# Patient Record
Sex: Male | Born: 1960 | Race: Black or African American | Hispanic: No | Marital: Married | Smoking: Former smoker
Health system: Southern US, Community
[De-identification: ages and names within clinical notes are randomized; demographics above are authoritative.]

## PROBLEM LIST (undated history)

## (undated) DIAGNOSIS — K219 Gastro-esophageal reflux disease without esophagitis: Secondary | ICD-10-CM

## (undated) DIAGNOSIS — E119 Type 2 diabetes mellitus without complications: Secondary | ICD-10-CM

## (undated) DIAGNOSIS — F32A Depression, unspecified: Secondary | ICD-10-CM

## (undated) DIAGNOSIS — I10 Essential (primary) hypertension: Secondary | ICD-10-CM

## (undated) DIAGNOSIS — R112 Nausea with vomiting, unspecified: Secondary | ICD-10-CM

## (undated) DIAGNOSIS — G4730 Sleep apnea, unspecified: Secondary | ICD-10-CM

## (undated) DIAGNOSIS — E7800 Pure hypercholesterolemia, unspecified: Secondary | ICD-10-CM

## (undated) DIAGNOSIS — F419 Anxiety disorder, unspecified: Secondary | ICD-10-CM

## (undated) DIAGNOSIS — Z9889 Other specified postprocedural states: Secondary | ICD-10-CM

## (undated) HISTORY — PX: KNEE ARTHROSCOPY WITH MENISCAL REPAIR: SHX5653

## (undated) HISTORY — PX: VASECTOMY: SHX75

## (undated) HISTORY — PX: WISDOM TOOTH EXTRACTION: SHX21

## (undated) HISTORY — PX: CHOLECYSTECTOMY: SHX55

## (undated) HISTORY — DX: Gastro-esophageal reflux disease without esophagitis: K21.9

---

## 2006-10-27 ENCOUNTER — Emergency Department (HOSPITAL_COMMUNITY): Admission: EM | Admit: 2006-10-27 | Discharge: 2006-10-27 | Payer: Self-pay | Admitting: Emergency Medicine

## 2006-11-12 ENCOUNTER — Emergency Department (HOSPITAL_COMMUNITY): Admission: EM | Admit: 2006-11-12 | Discharge: 2006-11-12 | Payer: Self-pay | Admitting: Emergency Medicine

## 2006-11-13 ENCOUNTER — Encounter: Admission: RE | Admit: 2006-11-13 | Discharge: 2006-11-13 | Payer: Self-pay | Admitting: Family Medicine

## 2007-08-02 ENCOUNTER — Ambulatory Visit (HOSPITAL_COMMUNITY): Admission: RE | Admit: 2007-08-02 | Discharge: 2007-08-02 | Payer: Self-pay | Admitting: Gastroenterology

## 2007-08-20 ENCOUNTER — Ambulatory Visit (HOSPITAL_COMMUNITY): Admission: RE | Admit: 2007-08-20 | Discharge: 2007-08-20 | Payer: Self-pay | Admitting: Surgery

## 2007-08-20 ENCOUNTER — Encounter (INDEPENDENT_AMBULATORY_CARE_PROVIDER_SITE_OTHER): Payer: Self-pay | Admitting: Surgery

## 2008-08-18 ENCOUNTER — Encounter: Admission: RE | Admit: 2008-08-18 | Discharge: 2008-08-18 | Payer: Self-pay | Admitting: Internal Medicine

## 2010-11-28 ENCOUNTER — Encounter: Payer: Self-pay | Admitting: Family Medicine

## 2011-03-22 NOTE — Op Note (Signed)
NAME:  CYPRUS, KUANG              ACCOUNT NO.:  [id number]   MEDICAL RECORD NO.:  [id number]          PATIENT TYPE:  AMB   LOCATION:  SDS                          FACILITY:  [facility]   PHYSICIAN:  Ardeth Sportsman, MD     DATE OF BIRTH:  [birth year]   DATE OF PROCEDURE:  08/20/2007  DATE OF DISCHARGE:                               OPERATIVE REPORT   PRIMARY CARE PHYSICIAN:  Betti D. Pecola Leisure, M.D., [facility].   GASTROENTEROLOGIST:  Anselmo Rod, M.D.   SURGEON:  Ardeth Sportsman, M.D.   ASSISTANT:  Marland Mcalpine, P.A. student.   PREOPERATIVE DIAGNOSES:  1. Symptomatic cholecystolithiasis.  2. Probable chronic cholecystitis.   POSTOPERATIVE DIAGNOSES:  1. Cholecystolithiasis.  2. Acute-on-chronic cholecystitis.   PROCEDURE:  Laparoscopic cholecystectomy with intraoperative  cholangiogram.   ANESTHESIA:  1. General anesthesia.  2. Local anesthetic in a field block around all port sites.   SPECIMENS:  Gallbladder.   DRAINS:  None.   ESTIMATED BLOOD LOSS:  30 mL.   COMPLICATIONS:  No major complications.   INDICATIONS:  Mr. Vanvranken is a 46-year gentleman with a story of  biliary colic and workup showing some gallbladder wall thickening and  numerous stones.  There is some question of maybe some cholecystitis.  This was done last month.  He had decreased gallbladder ejection  fraction as well.  Given classic the story, Dr. Loreta Ave felt he would  benefit from cholecystectomy and I agree.   The anatomy and physiology of hepatobiliary and pancreatic function was  discussed.  Pathophysiology of cholecystolithiasis with its risks of  gallstone pancreatitis, cholecystitis, choledocholithiasis, and other  risks were discussed.  The options were discussed recommendations made  for laparoscopic cholecystectomy with intraoperative cholangiogram.  The  risks such as stroke, heart attack, deep venous thrombosis, pulmonary  embolism, and death were discussed.  The risks such  as bleeding, need  for transfusion, wound infection, abscess, injury to other organs,  prolonged pain, incisional hernia, bile duct injury resulting in need  for operative reconstruction, and/or stenting, and other risks were  discussed.  Questions were answered, and he agreed to proceed.   PERIOPERATIVE FINDINGS:  He had gallbladder wall thickening, and his  gallbladder was almost completely full of stones.  He had a very turgid  gallbladder.  Cholangiogram appeared normal.  He had a fatty liver but  no discrete liver masses.   DESCRIPTION OF PROCEDURE:  Informed consent was confirmed.  The patient  had voided just prior to go int to the operating room.  He underwent  general anesthesia without any difficulty.  He had sequential  compression devices active during the entire case.  He underwent general  anesthesia without any problems.  He was positioned supine with both  arms tucked and protected with side rails.  His abdomen was prepped and  draped in sterile fashion.   Entry was gained in the abdomen with the patient in steep reversed  Trendelenburg with the right-side up using optical entry technique to  place a 5-mm port in the right upper quadrant.  Capnoperitoneum to 15 mm  provided good operative insufflation.  Under visualization, 5-mm ports  were placed through the umbilicus and one in the right flank.  A 10-mm  port was tunneled through the falciform ligament.   Camera inspection revealed no intra-abdominal injury.  The gallbladder  was distended and turgid.  It could not be easily grasped.  Aspiration  was done of a scant amount of bile only to shrink the gallbladder a  little bit, as it was extremely full of stones.  The gallbladder fundus  was grasped and elevated cephalad.  Peritoneal coverings between the  anteromedial and posterolateral sides of gallbladder and liver were  freed off.  Circumferential dissection was done to free the proximal  quarter of the  gallbladder off the liver bed.  A pulsatile structure  going into the anteromedial wall of the gallbladder consistent with  cystic anterior branch of the cystic artery was isolated.  Further  dissection revealed a posterior branch as well.  This left one  structure, the cystic duct, going from the gallbladder infundibulum down  to the porta hepatis.  One clip on the gallbladder side and two clips  were made on the anterior branch of the cystic artery.  This was  transected.  One clip was made on the gallbladder side on the posterior  branch and was it transected as well since the main branch had been  clamped off.  This left one structure consistent with the cystic duct.  One clip on the infundibulum was placed and a partial cystic ductotomy  was performed.   A 5-French cholangiocatheter was placed through a right subcostal stab  incision, flushed, and placed on the cystic duct and flushed.  Cholangiogram was run using diluted radio-opaque contrast and continuous  fluoroscopy.  Contrast flowed well from a side helical branch consistent  with cystic duct cannulization.  Contrast flowed well up a thin common  hepatic duct into the right and left intrahepatic chains.  Contrast  flowed down and across the common bile duct and across the normal  ampulla into the duodenum.  There was no evidence of any leak or stones  or lucencies.  This was consistent with a normal cholangiogram.  The  cholangiocatheter was removed.  Four clips remained on the cystic duct  just proximal to the partial cystic ductotomy, and cystic duct  transection was completed.   The gallbladder was freed from its remaining attachments on the liver.  It was placed in an EndoCatch bag.  The gallbladder was extremely thick  and dense and required expanding the fascial stitches about 2 cm in the  subxiphoid region to ultimately get it out.  The bag did rupture and had  to replace it in a new bag.  It was chocked full of  stones.  The port  was returned, and clamps were placed to help ensure capnoperitoneum.   Copious irrigation of almost 4 liters was done in the right upper  quadrant, with good clear return.  Hemostasis was excellent.  The clips  were intact on the cystic duct and arterial stumps with no evidence of  leak of bile or blood.  The upper abdominal ports were removed.  Capnoperitoneum was actively evacuated.  The ports were removed.  The  subxiphoid fascial defect was closed in transverse fashion using running  and interrupted #1 PDS to good result.  Copious irrigation of 500 mL was  done prior to closure and after closure of the fascia.  The skin was  closed using 4-0  Monocryl stitch at all port sites.  Sterile dressings  were applied.   The patient was extubated and taken to the recovery room in stable  condition.   I explained the operative findings to the patient's wife.  Postop  instructions were discussed as well.  Please note, I had discussed it  with the patient himself just prior to surgery as well.  Questions  answered and expressed understanding and appreciation.      Ardeth Sportsman, MD  Electronically Signed     SCG/MEDQ  D:  08/20/2007  T:  08/20/2007  Job:  [id number]

## 2011-05-25 ENCOUNTER — Ambulatory Visit: Payer: Self-pay

## 2011-05-25 ENCOUNTER — Other Ambulatory Visit: Payer: Self-pay | Admitting: Occupational Medicine

## 2011-05-25 DIAGNOSIS — R52 Pain, unspecified: Secondary | ICD-10-CM

## 2011-08-18 LAB — BASIC METABOLIC PANEL
BUN: 14
CO2: 27
Calcium: 9.4
Chloride: 104
Creatinine, Ser: 1.14
GFR calc Af Amer: >60
GFR calc non Af Amer: >60
Glucose, Bld: 113 — ABNORMAL HIGH
Potassium: 4.6
Sodium: 137

## 2011-08-18 LAB — CBC
HCT: 43.9
Hemoglobin: 14.7
MCHC: 33.6
MCV: 87.4
Platelets: 234
RBC: 5.02
RDW: 15.5 — ABNORMAL HIGH
WBC: 6.1

## 2011-11-19 ENCOUNTER — Emergency Department (HOSPITAL_COMMUNITY): Payer: Managed Care, Other (non HMO)

## 2011-11-19 ENCOUNTER — Emergency Department (HOSPITAL_COMMUNITY)
Admission: EM | Admit: 2011-11-19 | Discharge: 2011-11-19 | Disposition: A | Discharge status: Home / Self Care | Payer: Managed Care, Other (non HMO) | Attending: Emergency Medicine | Admitting: Emergency Medicine

## 2011-11-19 ENCOUNTER — Encounter (HOSPITAL_COMMUNITY): Payer: Self-pay | Admitting: Emergency Medicine

## 2011-11-19 DIAGNOSIS — M79609 Pain in unspecified limb: Secondary | ICD-10-CM | POA: Insufficient documentation

## 2011-11-19 DIAGNOSIS — R209 Unspecified disturbances of skin sensation: Secondary | ICD-10-CM | POA: Insufficient documentation

## 2011-11-19 DIAGNOSIS — M542 Cervicalgia: Secondary | ICD-10-CM | POA: Insufficient documentation

## 2011-11-19 DIAGNOSIS — I10 Essential (primary) hypertension: Secondary | ICD-10-CM | POA: Insufficient documentation

## 2011-11-19 DIAGNOSIS — M25519 Pain in unspecified shoulder: Secondary | ICD-10-CM | POA: Insufficient documentation

## 2011-11-19 DIAGNOSIS — E7800 Pure hypercholesterolemia, unspecified: Secondary | ICD-10-CM | POA: Insufficient documentation

## 2011-11-19 HISTORY — DX: Pure hypercholesterolemia, unspecified: E78.00

## 2011-11-19 HISTORY — DX: Essential (primary) hypertension: I10

## 2011-11-19 MED ORDER — MORPHINE SULFATE 4 MG/ML IJ SOLN
6.0000 mg | Freq: Once | INTRAMUSCULAR | Status: AC
  Administered 2011-11-19: 6 mg via INTRAMUSCULAR
  Filled 2011-11-19 (×2): qty 1

## 2011-11-19 MED ORDER — OXYCODONE HCL 10 MG PO TB12: 10.0000 mg | ORAL_TABLET | Freq: Two times a day (BID) | ORAL | Status: AC

## 2011-11-19 MED ORDER — MORPHINE SULFATE 4 MG/ML IJ SOLN
4.0000 mg | Freq: Once | INTRAMUSCULAR | Status: AC
  Administered 2011-11-19: 4 mg via INTRAMUSCULAR
  Filled 2011-11-19: qty 1

## 2011-11-19 NOTE — ED Notes (Signed)
Pt c/o neck pain x 6 days; pt sts woke up with pain; pt sts pain worse with movement; pt denies obvious injury; pt sts went to [facility] on Monday for same and was given meds that are not helping

## 2011-11-19 NOTE — ED Notes (Signed)
Spoke w/Christina in MRI - advised MRI of neck is to be w/o constrast - as per Fayrene Helper, PA.  She advised MRI table limit is 350lb and will try to perform on pt.

## 2011-11-19 NOTE — ED Notes (Signed)
Spoke w/radiology - advised will be coming to transport pt next.

## 2011-11-19 NOTE — ED Provider Notes (Signed)
Medical screening examination/treatment/procedure(s) were performed by non-physician practitioner and as supervising physician I was immediately available for consultation/collaboration.   Leigh-Ann Uriel Dowding, MD 11/19/11 1317 

## 2011-11-19 NOTE — ED Notes (Signed)
Pt spoke w/family member who advised will come to ED to pick him up.

## 2011-11-19 NOTE — ED Provider Notes (Signed)
History     CSN: [id number]  Arrival date & time 11/19/11  0715   First MD Initiated Contact with Patient 11/19/11 [phone]      Chief Complaint  Patient presents with  . Neck Pain    (Consider location/radiation/quality/duration/timing/severity/associated sxs/prior treatment) HPI  51 year old male presenting today with chief complaints of neck pain. About 6 days ago patient woke up with pain to the back of his neck. Pain is described as dull, constant, and intense.  Neck pain worsened with hyper extension and improves with flexion. Patient also notice pain shooting and radiating down to right shoulder and arm with associated tingling numbness sensation. Patient denies fever, headache, chest pain, shortness of breath, rash. He denies any prior strenuous activity or heavy lifting. He has been seen and evaluated at an urgent care center 6 days ago. He was given prednisone, oxycodone, diclofenac.  He has been taking medication as prescribed but has not noticed any improvement. He is having trouble sleeping due to pain. Patient doesn't recall a distant history of herniated disc in neck, which does not required surgery.    Past Medical History  Diagnosis Date  . Hypertension   . Hypercholesterolemia     History reviewed. No pertinent past surgical history.  History reviewed. No pertinent family history.  History  Substance Use Topics  . Smoking status: Never Smoker   . Smokeless tobacco: Not on file  . Alcohol Use: No      Review of Systems  All other systems reviewed and are negative.    Allergies  Codeine  Home Medications  No current outpatient prescriptions on file.  BP 142/79  Pulse 84  Temp(Src) 97.9 F (36.6 C) (Oral)  Resp 18  SpO2 97%  Physical Exam  Nursing note and vitals reviewed. Constitutional: He is oriented to person, place, and time. He appears well-developed and well-nourished.       Awake, alert, nontoxic appearance.  Obese  HENT:  Head:  Normocephalic and atraumatic.  Eyes: Right eye exhibits no discharge. Left eye exhibits no discharge.  Neck: Normal range of motion. Neck supple.       Midline tenderness, with increase tenderness with hyperextension.  No nuchal rigidity, no rash, or overlying skin changes.  Sensation intact.  Pulmonary/Chest: Effort normal. He exhibits no tenderness.  Abdominal: There is no tenderness. There is no rebound.  Musculoskeletal: He exhibits no tenderness.       Right shoulder: Normal.       Left shoulder: Normal.       Arms:      Baseline ROM, no obvious new focal weakness  Neurological: He is alert and oriented to person, place, and time. He displays normal reflexes. He exhibits normal muscle tone. Coordination normal.       Mental status and motor strength appears baseline for patient and situation  Skin: No rash noted.  Psychiatric: He has a normal mood and affect.    ED Course  Procedures (including critical care time)  Labs Reviewed - No data to display No results found.   No diagnosis found.    MDM  Neck pain unrelieved with prednisone/oxycodone/diclofenac.  Pain suggestive of cervical disc herniation.  No obvious upper motor neuron deficits, normal lower motor neuron function.  Doubt infection, or cardiac related.  Doubt stroke, or brain infection.  9:05 AM Patient was unable to fit in closed MRI machine. Therefore, patient will likely need an open MRI unit for further evaluation.  However, this is non emergent,  and pt is in no acute distress with stable vital sign.  I will give him a referral to orthopedist for further management.  Pt was recommended to double up on his pain medication as current dose is subtherapeutic due to body habitus.  Today C-spine xray reveals no acute findings.      Fayrene Helper, PA-C 11/19/11 1029

## 2012-01-06 ENCOUNTER — Other Ambulatory Visit: Payer: Self-pay | Admitting: Family Medicine

## 2012-01-06 DIAGNOSIS — M542 Cervicalgia: Secondary | ICD-10-CM

## 2012-01-12 ENCOUNTER — Ambulatory Visit
Admission: RE | Admit: 2012-01-12 | Discharge: 2012-01-12 | Disposition: A | Discharge status: Home / Self Care | Payer: PRIVATE HEALTH INSURANCE | Source: Ambulatory Visit | Attending: Family Medicine | Admitting: Family Medicine

## 2012-01-12 DIAGNOSIS — M542 Cervicalgia: Secondary | ICD-10-CM

## 2013-09-30 ENCOUNTER — Encounter: Payer: Self-pay | Admitting: Podiatry

## 2013-09-30 ENCOUNTER — Ambulatory Visit (INDEPENDENT_AMBULATORY_CARE_PROVIDER_SITE_OTHER): Payer: Managed Care, Other (non HMO) | Admitting: Podiatry

## 2013-09-30 VITALS — BP 141/78 | HR 63 | Resp 16 | Ht 74.0 in | Wt 325.0 lb

## 2013-09-30 DIAGNOSIS — Q828 Other specified congenital malformations of skin: Secondary | ICD-10-CM

## 2013-09-30 NOTE — Progress Notes (Signed)
  Subjective:    Patient ID: Juan [name], male    DOB: Jun 27, 1961, 52 y.o.   MRN: [id number] "I've got this painful growth on the side of my left foot."  Foot Pain This is a new problem. Episode onset: 1 year. The problem occurs intermittently. The problem has been gradually worsening. The symptoms are aggravated by walking and standing (when I wear certain shoes). Treatments tried: My mom trimmed it a little bit.  I put some Salicylic Acid on it. The treatment provided mild relief.      Review of Systems  All other systems reviewed and are negative.       Objective:   Physical Exam Vascular: The DP PT pulses are two over four bilaterally  Neurological: Sensation intact bilaterally  Dermatological: Nucleated keratoses plantar lateral aspect of the fifth left MPJ.  Musculoskeletal: No deformities noted bilaterally.       Assessment & Plan:   Assessment: Porokeratoses left foot x1  Plan: The porokeratoses was debrided and packed with salinocaine. Patient advised that this will become a chronic lesion and to return as needed. I also made him aware that he could apply some topical callus remover to this area it if he chose to on an as-needed basis.

## 2015-11-08 HISTORY — PX: COLONOSCOPY: SHX174

## 2019-04-25 ENCOUNTER — Other Ambulatory Visit: Payer: Self-pay | Admitting: Urology

## 2019-06-10 ENCOUNTER — Other Ambulatory Visit (HOSPITAL_COMMUNITY)
Admission: RE | Admit: 2019-06-10 | Discharge: 2019-06-10 | Disposition: A | Discharge status: Home / Self Care | Payer: 59 | Source: Ambulatory Visit | Attending: Urology | Admitting: Urology

## 2019-06-10 ENCOUNTER — Other Ambulatory Visit (HOSPITAL_COMMUNITY): Payer: Self-pay | Admitting: *Deleted

## 2019-06-10 DIAGNOSIS — Z20828 Contact with and (suspected) exposure to other viral communicable diseases: Secondary | ICD-10-CM | POA: Insufficient documentation

## 2019-06-10 DIAGNOSIS — N400 Enlarged prostate without lower urinary tract symptoms: Secondary | ICD-10-CM | POA: Diagnosis not present

## 2019-06-10 DIAGNOSIS — Z01812 Encounter for preprocedural laboratory examination: Secondary | ICD-10-CM | POA: Insufficient documentation

## 2019-06-10 LAB — SARS CORONAVIRUS 2 (TAT 6-24 HRS): SARS Coronavirus 2: NEGATIVE

## 2019-06-10 NOTE — Patient Instructions (Addendum)
YOU HAD A COVID 19 TEST ON 06-10-2019. ONCE YOUR COVID TEST IS COMPLETED, PLEASE BEGIN THE QUARANTINE INSTRUCTIONS AS OUTLINED IN YOUR HANDOUT.                Juan [name]    Your procedure is scheduled on: 06-13-2019   Report to [facility] Main  Entrance  Report to admitting at 10:30  AM   1 VISITOR IS ALLOWED TO WAIT IN WAITING ROOM  ONLY DAY OF YOUR SURGERY.  No family in Short Stay   Call this number if you have problems the morning of surgery [phone]    Remember: Do not eat food :After Midnight Tuesday NIGHT,                                     CLEAR LIQUIDS ALL DAY Wednesday 06-12-2019   NO CLEAR LIQUIDS AFTER MIDNIGHT. 8/5  FOLLOW ALL BOWEL PREP INSTRUCTIONS FROM DR [facility].   BRUSH YOUR TEETH MORNING OF SURGERY AND RINSE YOUR MOUTH OUT, NO CHEWING GUM CANDY OR MINTS.      CLEAR LIQUID DIET   Foods Allowed                                                                     Foods Excluded  Coffee and tea, regular and decaf                             liquids that you cannot  Plain Jell-O any favor except red or purple                                           see through such as: Fruit ices (not with fruit pulp)                                     milk, soups, orange juice  Iced Popsicles                                    All solid food Carbonated beverages, regular and diet                                    Cranberry, grape and apple juices Sports drinks like Gatorade Lightly seasoned clear broth or consume(fat free) Sugar, honey syrup  Sample Menu Breakfast                                Lunch                                     Supper Cranberry juice  Beef broth                            Chicken broth Jell-O                                     Grape juice                           Apple juice Coffee or tea                        Jell-O                                      Popsicle                                                 Coffee or tea                        Coffee or tea  _____________________________________________________________________    Take these medicines the morning of surgery with A SIP OF WATER: Prilosec              You may not have any metal on your body including piercings               Do not wear jewelry,  lotions, powders or  deodorant                          Men may shave face and neck.   Do not bring valuables to the hospital. [place].  Contacts, dentures or bridgework may not be worn into surgery.      [place] - Preparing for Surgery Before surgery, you can play an important role.   Because skin is not sterile, your skin needs to be as free of germs as possible.   You can reduce the number of germs on your skin by washing with CHG (chlorahexidine gluconate) soap before surgery .  CHG is an antiseptic cleaner which kills germs and bonds with the skin to continue killing germs even after washing. Please DO NOT use if you have an allergy to CHG or antibacterial soaps.   If your skin becomes reddened/irritated stop using the CHG and inform your nurse when you arrive at Short Stay. .  You may shave your face/neck. Please follow these instructions carefully:  1.  Shower with CHG Soap the night before surgery and the  morning of Surgery.  2.  If you choose to wash your hair, wash your hair first as usual with your  normal  shampoo.  3.  After you shampoo, rinse your hair and body thoroughly to remove the  shampoo.  4.  Use CHG as you would any other liquid soap.  You can apply chg directly  to the skin and wash                       Gently with a scrungie or clean washcloth.  5.  Apply the CHG Soap to your body ONLY FROM THE NECK DOWN.   Do not use on face/ open                           Wound or open sores. Avoid contact with eyes, ears mouth and genitals (private parts).                        Wash face,  Genitals (private parts) with your normal soap.             6.  Wash thoroughly, paying special attention to the area where your surgery  will be performed.  7.  Thoroughly rinse your body with warm water from the neck down.  8.  DO NOT shower/wash with your normal soap after using and rinsing off  the CHG Soap.              9.  Pat yourself dry with a clean towel.            10.  Wear clean pajamas.            11.  Place clean sheets on your bed the night of your first shower and do not  sleep with pets. Day of Surgery : Do not apply any lotions/deodorants the morning of surgery.  Please wear clean clothes to the hospital/surgery center.  FAILURE TO FOLLOW THESE INSTRUCTIONS MAY RESULT IN THE CANCELLATION OF YOUR SURGERY PATIENT SIGNATURE_________________________________  NURSE SIGNATURE__________________________________  ________________________________________________________________________

## 2019-06-11 ENCOUNTER — Other Ambulatory Visit: Payer: Self-pay

## 2019-06-11 ENCOUNTER — Encounter (INDEPENDENT_AMBULATORY_CARE_PROVIDER_SITE_OTHER): Payer: Self-pay

## 2019-06-11 ENCOUNTER — Encounter (HOSPITAL_COMMUNITY)
Admission: RE | Admit: 2019-06-11 | Discharge: 2019-06-11 | Disposition: A | Discharge status: Home / Self Care | Payer: 59 | Source: Ambulatory Visit | Attending: Urology | Admitting: Urology

## 2019-06-11 ENCOUNTER — Encounter (HOSPITAL_COMMUNITY): Payer: Self-pay

## 2019-06-11 DIAGNOSIS — N400 Enlarged prostate without lower urinary tract symptoms: Secondary | ICD-10-CM | POA: Insufficient documentation

## 2019-06-11 DIAGNOSIS — I10 Essential (primary) hypertension: Secondary | ICD-10-CM | POA: Insufficient documentation

## 2019-06-11 DIAGNOSIS — Z01818 Encounter for other preprocedural examination: Secondary | ICD-10-CM | POA: Insufficient documentation

## 2019-06-11 DIAGNOSIS — R9431 Abnormal electrocardiogram [ECG] [EKG]: Secondary | ICD-10-CM | POA: Insufficient documentation

## 2019-06-11 HISTORY — DX: Nausea with vomiting, unspecified: R11.2

## 2019-06-11 HISTORY — DX: Sleep apnea, unspecified: G47.30

## 2019-06-11 HISTORY — DX: Other specified postprocedural states: Z98.890

## 2019-06-11 LAB — BASIC METABOLIC PANEL
Anion gap: 8 (ref 5–15)
BUN: 16 mg/dL (ref 6–20)
CO2: 26 mmol/L (ref 22–32)
Calcium: 9.1 mg/dL (ref 8.9–10.3)
Chloride: 103 mmol/L (ref 98–111)
Creatinine, Ser: 1.3 mg/dL — ABNORMAL HIGH (ref 0.61–1.24)
GFR calc Af Amer: >60 mL/min (ref >60)
GFR calc non Af Amer: >60 mL/min (ref >60)
Glucose, Bld: 164 mg/dL — ABNORMAL HIGH (ref 70–99)
Potassium: 4.2 mmol/L (ref 3.5–5.1)
Sodium: 137 mmol/L (ref 135–145)

## 2019-06-11 LAB — CBC
HCT: 49.6 % (ref 39.0–52.0)
Hemoglobin: 15.2 g/dL (ref 13.0–17.0)
MCH: 28 pg (ref 26.0–34.0)
MCHC: 30.6 g/dL (ref 30.0–36.0)
MCV: 91.5 fL (ref 80.0–100.0)
Platelets: 186 10*3/uL (ref 150–400)
RBC: 5.42 MIL/uL (ref 4.22–5.81)
RDW: 15.6 % — ABNORMAL HIGH (ref 11.5–15.5)
WBC: 6.2 10*3/uL (ref 4.0–10.5)
nRBC: 0 % (ref 0.0–0.2)

## 2019-06-11 MED ORDER — MAGNESIUM CITRATE PO SOLN
1.0000 | Freq: Once | ORAL | Status: DC
  Filled 2019-06-11: qty 296

## 2019-06-12 MED ORDER — DEXTROSE 5 % IV SOLN
3.0000 g | INTRAVENOUS | Status: AC
  Administered 2019-06-13: 3 g via INTRAVENOUS
  Filled 2019-06-12: qty 3

## 2019-06-12 NOTE — Anesthesia Preprocedure Evaluation (Addendum)
Anesthesia Evaluation  Patient identified by MRN, date of birth, ID band Patient awake    Reviewed: Allergy & Precautions, NPO status , Patient's Chart, lab work & pertinent test results  History of Anesthesia Complications (+) PONV  Airway Mallampati: III  TM Distance: >3 FB Neck ROM: Full    Dental  (+) Teeth Intact, Dental Advisory Given   Pulmonary sleep apnea , former smoker,    breath sounds clear to auscultation       Cardiovascular hypertension,  Rhythm:Regular Rate:Normal     Neuro/Psych negative neurological ROS  negative psych ROS   GI/Hepatic Neg liver ROS, GERD  ,  Endo/Other  negative endocrine ROS  Renal/GU negative Renal ROS     Musculoskeletal negative musculoskeletal ROS (+)   Abdominal (+) + obese,   Peds  Hematology negative hematology ROS (+)   Anesthesia Other Findings   Reproductive/Obstetrics                            Anesthesia Physical Anesthesia Plan  ASA: III  Anesthesia Plan: General   Post-op Pain Management:    Induction: Intravenous  PONV Risk Score and Plan: 4 or greater and Ondansetron, Dexamethasone, Midazolam and Scopolamine patch - Pre-op  Airway Management Planned: Oral ETT  Additional Equipment: None  Intra-op Plan:   Post-operative Plan: Extubation in OR  Informed Consent: I have reviewed the patients History and Physical, chart, labs and discussed the procedure including the risks, benefits and alternatives for the proposed anesthesia with the patient or authorized representative who has indicated his/her understanding and acceptance.     Dental advisory given  Plan Discussed with: CRNA  Anesthesia Plan Comments: (See PAT note 06/11/2019, Konrad Felix, PA-C  COVID-19 Labs  No results for input(s): DDIMER, FERRITIN, LDH, CRP in the last 72 hours.  Lab Results      Component                Value               Date                       [id number]              NEGATIVE            06/10/2019            )       Anesthesia Quick Evaluation

## 2019-06-12 NOTE — Progress Notes (Signed)
Anesthesia Chart Review   Case: [id number] Date/Time: 06/13/19 1200   Procedure: XI ROBOTIC ASSISTED SIMPLE PROSTATECTOMY (N/A ) - 3 HRS   Anesthesia type: General   Pre-op diagnosis: BENIGN PROSTATIC HYPERPLASIA   Location: [facility] ROOM 03 / [facility]   Surgeon: Cleon Gustin, MD      DISCUSSION:58 y.o. former smoker (5 pack years, quit 11/07/97) with h/o PONV, GERD, HTN, sleep apnea, BPH scheduled for above procedure 06/13/2019 with Dr. Nicolette Bang.   Elevated BP at PAT visit.  Pt reports he checks his BP at home regularly and it is typically around 140/85.  He has taken medications today.  Asymptomatic.  Will evaluate DOS. [facility]   VS: BP (!) 181/101   Pulse 75   Temp 37.3 C (Oral)   Resp 18   Ht 6\' 2"  (1.88 m)   Wt (!) 159.8 kg   SpO2 99%   BMI 45.23 kg/m   PROVIDERS: Patient, No Pcp Per   LABS: Labs reviewed: Acceptable for surgery. (all labs ordered are listed, but only abnormal results are displayed)  Labs Reviewed  CBC - Abnormal; Notable for the following components:      Result Value   RDW 15.6 (*)    All other components within normal limits  BASIC METABOLIC PANEL - Abnormal; Notable for the following components:   Glucose, Bld 164 (*)    Creatinine, Ser 1.30 (*)    All other components within normal limits     IMAGES:   EKG: 06/11/2019 Rate 79 bpm Normal sinus rhythm Nonspecific T wave abnormality Abnormal ECG Since last tracing QT has shortened HEART RATE has decreased  CV:  Past Medical History:  Diagnosis Date  . GERD (gastroesophageal reflux disease)    mild  . Hypercholesterolemia   . Hypertension   . PONV (postoperative nausea and vomiting)   . Sleep apnea     Past Surgical History:  Procedure Laterality Date  . CHOLECYSTECTOMY    . COLONOSCOPY  2017  . KNEE ARTHROSCOPY WITH MENISCAL REPAIR Right   . VASECTOMY    . WISDOM TOOTH EXTRACTION Bilateral     MEDICATIONS: . diphenhydrAMINE HCl, Sleep, (ZZZQUIL) 25 MG  CAPS  . ibuprofen (ADVIL) 200 MG tablet  . lisinopril (PRINIVIL,ZESTRIL) 20 MG tablet  . omeprazole (PRILOSEC) 20 MG capsule  . oxymetazoline (AFRIN) 0.05 % nasal spray  . pravastatin (PRAVACHOL) 40 MG tablet   No current facility-administered medications for this encounter.    Derrill Memo ON 06/13/2019] ceFAZolin (ANCEF) 3 g in dextrose 5 % 50 mL IVPB    Maia Plan [facility] Pre-Surgical Testing [phone] 06/12/19  1:01 PM

## 2019-06-13 ENCOUNTER — Inpatient Hospital Stay (HOSPITAL_COMMUNITY)
Admission: RE | Admit: 2019-06-13 | Discharge: 2019-06-20 | DRG: 707 | Disposition: A | Discharge status: Home / Self Care | Payer: 59 | Attending: Urology | Admitting: Urology

## 2019-06-13 ENCOUNTER — Inpatient Hospital Stay (HOSPITAL_COMMUNITY): Payer: 59 | Admitting: Certified Registered Nurse Anesthetist

## 2019-06-13 ENCOUNTER — Encounter (HOSPITAL_COMMUNITY): Payer: Self-pay | Admitting: *Deleted

## 2019-06-13 ENCOUNTER — Other Ambulatory Visit: Payer: Self-pay

## 2019-06-13 ENCOUNTER — Encounter (HOSPITAL_COMMUNITY)
Admission: RE | Disposition: A | Discharge status: Home / Self Care | Payer: Self-pay | Source: Home / Self Care | Attending: Urology

## 2019-06-13 DIAGNOSIS — N401 Enlarged prostate with lower urinary tract symptoms: Secondary | ICD-10-CM | POA: Diagnosis present

## 2019-06-13 DIAGNOSIS — N3289 Other specified disorders of bladder: Secondary | ICD-10-CM | POA: Diagnosis present

## 2019-06-13 DIAGNOSIS — Z87891 Personal history of nicotine dependence: Secondary | ICD-10-CM | POA: Diagnosis not present

## 2019-06-13 DIAGNOSIS — Z8249 Family history of ischemic heart disease and other diseases of the circulatory system: Secondary | ICD-10-CM

## 2019-06-13 DIAGNOSIS — C61 Malignant neoplasm of prostate: Secondary | ICD-10-CM | POA: Diagnosis present

## 2019-06-13 DIAGNOSIS — I10 Essential (primary) hypertension: Secondary | ICD-10-CM | POA: Diagnosis present

## 2019-06-13 DIAGNOSIS — Z9049 Acquired absence of other specified parts of digestive tract: Secondary | ICD-10-CM | POA: Diagnosis not present

## 2019-06-13 DIAGNOSIS — R350 Frequency of micturition: Secondary | ICD-10-CM | POA: Diagnosis present

## 2019-06-13 DIAGNOSIS — Z885 Allergy status to narcotic agent status: Secondary | ICD-10-CM

## 2019-06-13 DIAGNOSIS — E7800 Pure hypercholesterolemia, unspecified: Secondary | ICD-10-CM | POA: Diagnosis present

## 2019-06-13 DIAGNOSIS — N138 Other obstructive and reflux uropathy: Secondary | ICD-10-CM | POA: Diagnosis present

## 2019-06-13 DIAGNOSIS — K219 Gastro-esophageal reflux disease without esophagitis: Secondary | ICD-10-CM | POA: Diagnosis present

## 2019-06-13 HISTORY — PX: XI ROBOTIC ASSISTED SIMPLE PROSTATECTOMY: SHX6713

## 2019-06-13 LAB — HEMOGLOBIN AND HEMATOCRIT, BLOOD
HCT: 52.2 % — ABNORMAL HIGH (ref 39.0–52.0)
HCT: 48.4 % (ref 39.0–52.0)
Hemoglobin: 16 g/dL (ref 13.0–17.0)
Hemoglobin: 15.1 g/dL (ref 13.0–17.0)

## 2019-06-13 LAB — GLUCOSE, CAPILLARY: Glucose-Capillary: 148 mg/dL — ABNORMAL HIGH (ref 70–99)

## 2019-06-13 SURGERY — PROSTATECTOMY, SIMPLE, ROBOT-ASSISTED
Anesthesia: General

## 2019-06-13 MED ORDER — DEXAMETHASONE SODIUM PHOSPHATE 10 MG/ML IJ SOLN
INTRAMUSCULAR | Status: AC
  Filled 2019-06-13: qty 1

## 2019-06-13 MED ORDER — HYDROMORPHONE HCL 1 MG/ML IJ SOLN
INTRAMUSCULAR | Status: AC
  Filled 2019-06-13: qty 2

## 2019-06-13 MED ORDER — SUGAMMADEX SODIUM 500 MG/5ML IV SOLN
INTRAVENOUS | Status: DC | PRN
  Administered 2019-06-13: 320 mg via INTRAVENOUS

## 2019-06-13 MED ORDER — LIDOCAINE 2% (20 MG/ML) 5 ML SYRINGE
INTRAMUSCULAR | Status: AC
  Filled 2019-06-13: qty 5

## 2019-06-13 MED ORDER — LABETALOL HCL 5 MG/ML IV SOLN
INTRAVENOUS | Status: DC | PRN
  Administered 2019-06-13 (×3): 2.5 mg via INTRAVENOUS

## 2019-06-13 MED ORDER — SODIUM CHLORIDE 0.9 % IV BOLUS
1000.0000 mL | Freq: Once | INTRAVENOUS | Status: AC
  Administered 2019-06-13: 17:00:00 1000 mL via INTRAVENOUS

## 2019-06-13 MED ORDER — ONDANSETRON HCL 4 MG/2ML IJ SOLN
INTRAMUSCULAR | Status: AC
  Filled 2019-06-13: qty 2

## 2019-06-13 MED ORDER — ONDANSETRON HCL 4 MG/2ML IJ SOLN
INTRAMUSCULAR | Status: DC | PRN
  Administered 2019-06-13 (×2): 4 mg via INTRAVENOUS

## 2019-06-13 MED ORDER — SCOPOLAMINE 1 MG/3DAYS TD PT72
MEDICATED_PATCH | TRANSDERMAL | Status: DC | PRN
  Administered 2019-06-13: 1 via TRANSDERMAL

## 2019-06-13 MED ORDER — FENTANYL CITRATE (PF) 100 MCG/2ML IJ SOLN
25.0000 ug | INTRAMUSCULAR | Status: DC | PRN
  Administered 2019-06-13: 25 ug via INTRAVENOUS
  Administered 2019-06-13 – 2019-06-14 (×13): 50 ug via INTRAVENOUS
  Administered 2019-06-15: 20:00:00 25 ug via INTRAVENOUS
  Administered 2019-06-15: 50 ug via INTRAVENOUS
  Administered 2019-06-15 – 2019-06-16 (×3): 25 ug via INTRAVENOUS
  Administered 2019-06-16: 50 ug via INTRAVENOUS
  Filled 2019-06-13 (×21): qty 2

## 2019-06-13 MED ORDER — PROPOFOL 10 MG/ML IV BOLUS
INTRAVENOUS | Status: AC
  Filled 2019-06-13: qty 40

## 2019-06-13 MED ORDER — DEXTROSE-NACL 5-0.45 % IV SOLN
INTRAVENOUS | Status: DC
  Administered 2019-06-13 – 2019-06-15 (×5): via INTRAVENOUS

## 2019-06-13 MED ORDER — SODIUM CHLORIDE 0.9 % IR SOLN
3000.0000 mL | Status: DC
  Administered 2019-06-13 – 2019-06-16 (×13): 3000 mL

## 2019-06-13 MED ORDER — SULFAMETHOXAZOLE-TRIMETHOPRIM 800-160 MG PO TABS: 1.0000 | ORAL_TABLET | Freq: Two times a day (BID) | ORAL | 0 refills | Status: DC

## 2019-06-13 MED ORDER — FENTANYL CITRATE (PF) 100 MCG/2ML IJ SOLN
INTRAMUSCULAR | Status: DC | PRN
  Administered 2019-06-13: 100 ug via INTRAVENOUS
  Administered 2019-06-13 (×2): 50 ug via INTRAVENOUS
  Administered 2019-06-13: 100 ug via INTRAVENOUS
  Administered 2019-06-13: 50 ug via INTRAVENOUS

## 2019-06-13 MED ORDER — PANTOPRAZOLE SODIUM 40 MG PO TBEC
40.0000 mg | DELAYED_RELEASE_TABLET | Freq: Every day | ORAL | Status: DC
  Administered 2019-06-13 – 2019-06-20 (×8): 40 mg via ORAL
  Filled 2019-06-13 (×8): qty 1

## 2019-06-13 MED ORDER — ACETAMINOPHEN 10 MG/ML IV SOLN
INTRAVENOUS | Status: AC
  Filled 2019-06-13: qty 100

## 2019-06-13 MED ORDER — LACTATED RINGERS IV SOLN
INTRAVENOUS | Status: DC
  Administered 2019-06-13 (×2): via INTRAVENOUS

## 2019-06-13 MED ORDER — TRAMADOL HCL 50 MG PO TABS
50.0000 mg | ORAL_TABLET | Freq: Four times a day (QID) | ORAL | Status: DC | PRN
  Administered 2019-06-14 – 2019-06-15 (×3): 100 mg via ORAL
  Administered 2019-06-15: 50 mg via ORAL
  Administered 2019-06-15 – 2019-06-16 (×2): 100 mg via ORAL
  Filled 2019-06-13: qty 2
  Filled 2019-06-13: qty 1
  Filled 2019-06-13 (×5): qty 2

## 2019-06-13 MED ORDER — ACETAMINOPHEN 500 MG PO TABS
1000.0000 mg | ORAL_TABLET | Freq: Four times a day (QID) | ORAL | Status: AC
  Administered 2019-06-13 – 2019-06-14 (×3): 1000 mg via ORAL
  Filled 2019-06-13 (×3): qty 2

## 2019-06-13 MED ORDER — FENTANYL CITRATE (PF) 250 MCG/5ML IJ SOLN
INTRAMUSCULAR | Status: AC
  Filled 2019-06-13: qty 5

## 2019-06-13 MED ORDER — SODIUM CHLORIDE (PF) 0.9 % IJ SOLN
INTRAMUSCULAR | Status: DC | PRN
  Administered 2019-06-13: 20 mL

## 2019-06-13 MED ORDER — ACETAMINOPHEN 325 MG PO TABS: 325.0000 mg | ORAL_TABLET | Freq: Once | ORAL | Status: DC | PRN

## 2019-06-13 MED ORDER — LABETALOL HCL 5 MG/ML IV SOLN
5.0000 mg | INTRAVENOUS | Status: AC | PRN
  Administered 2019-06-13 (×3): 5 mg via INTRAVENOUS

## 2019-06-13 MED ORDER — DIPHENHYDRAMINE HCL 50 MG/ML IJ SOLN: 12.5000 mg | Freq: Four times a day (QID) | INTRAMUSCULAR | Status: DC | PRN

## 2019-06-13 MED ORDER — MIDAZOLAM HCL 5 MG/5ML IJ SOLN
INTRAMUSCULAR | Status: DC | PRN
  Administered 2019-06-13: 2 mg via INTRAVENOUS

## 2019-06-13 MED ORDER — PROPOFOL 10 MG/ML IV BOLUS
INTRAVENOUS | Status: DC | PRN
  Administered 2019-06-13: 200 mg via INTRAVENOUS

## 2019-06-13 MED ORDER — MEPERIDINE HCL 50 MG/ML IJ SOLN: 6.2500 mg | INTRAMUSCULAR | Status: DC | PRN

## 2019-06-13 MED ORDER — ACETAMINOPHEN 10 MG/ML IV SOLN
1000.0000 mg | Freq: Once | INTRAVENOUS | Status: DC | PRN
  Administered 2019-06-13: 1000 mg via INTRAVENOUS

## 2019-06-13 MED ORDER — ROCURONIUM BROMIDE 50 MG/5ML IV SOSY
PREFILLED_SYRINGE | INTRAVENOUS | Status: DC | PRN
  Administered 2019-06-13 (×2): 20 mg via INTRAVENOUS
  Administered 2019-06-13: 50 mg via INTRAVENOUS

## 2019-06-13 MED ORDER — HYDROMORPHONE HCL 1 MG/ML IJ SOLN
0.2500 mg | INTRAMUSCULAR | Status: DC | PRN
  Administered 2019-06-13 (×4): 0.5 mg via INTRAVENOUS

## 2019-06-13 MED ORDER — ACETAMINOPHEN 160 MG/5ML PO SOLN: 325.0000 mg | Freq: Once | ORAL | Status: DC | PRN

## 2019-06-13 MED ORDER — SODIUM CHLORIDE (PF) 0.9 % IJ SOLN
INTRAMUSCULAR | Status: AC
  Filled 2019-06-13: qty 20

## 2019-06-13 MED ORDER — ONDANSETRON HCL 4 MG/2ML IJ SOLN
4.0000 mg | INTRAMUSCULAR | Status: DC | PRN
  Administered 2019-06-14 – 2019-06-16 (×5): 4 mg via INTRAVENOUS
  Filled 2019-06-13 (×5): qty 2

## 2019-06-13 MED ORDER — LABETALOL HCL 5 MG/ML IV SOLN
INTRAVENOUS | Status: AC
  Filled 2019-06-13: qty 4

## 2019-06-13 MED ORDER — LISINOPRIL 20 MG PO TABS
20.0000 mg | ORAL_TABLET | Freq: Every day | ORAL | Status: DC
  Administered 2019-06-14 – 2019-06-20 (×7): 20 mg via ORAL
  Filled 2019-06-13 (×8): qty 1

## 2019-06-13 MED ORDER — DIPHENHYDRAMINE HCL 12.5 MG/5ML PO ELIX: 12.5000 mg | ORAL_SOLUTION | Freq: Four times a day (QID) | ORAL | Status: DC | PRN

## 2019-06-13 MED ORDER — DOCUSATE SODIUM 100 MG PO CAPS
100.0000 mg | ORAL_CAPSULE | Freq: Two times a day (BID) | ORAL | Status: DC
  Administered 2019-06-13 – 2019-06-16 (×6): 100 mg via ORAL
  Filled 2019-06-13 (×6): qty 1

## 2019-06-13 MED ORDER — SUCCINYLCHOLINE CHLORIDE 200 MG/10ML IV SOSY
PREFILLED_SYRINGE | INTRAVENOUS | Status: DC | PRN
  Administered 2019-06-13: 140 mg via INTRAVENOUS

## 2019-06-13 MED ORDER — LIDOCAINE 2% (20 MG/ML) 5 ML SYRINGE
INTRAMUSCULAR | Status: DC | PRN
  Administered 2019-06-13: 100 mg via INTRAVENOUS

## 2019-06-13 MED ORDER — ROCURONIUM BROMIDE 10 MG/ML (PF) SYRINGE
PREFILLED_SYRINGE | INTRAVENOUS | Status: AC
  Filled 2019-06-13: qty 10

## 2019-06-13 MED ORDER — PRAVASTATIN SODIUM 40 MG PO TABS
40.0000 mg | ORAL_TABLET | Freq: Every day | ORAL | Status: DC
  Administered 2019-06-13 – 2019-06-19 (×7): 40 mg via ORAL
  Filled 2019-06-13 (×7): qty 1

## 2019-06-13 MED ORDER — TRAMADOL HCL 50 MG PO TABS: 50.0000 mg | ORAL_TABLET | Freq: Four times a day (QID) | ORAL | 0 refills | Status: DC | PRN

## 2019-06-13 MED ORDER — LACTATED RINGERS IR SOLN
Status: DC | PRN
  Administered 2019-06-13: 1000 mL

## 2019-06-13 MED ORDER — SUGAMMADEX SODIUM 500 MG/5ML IV SOLN
INTRAVENOUS | Status: AC
  Filled 2019-06-13: qty 5

## 2019-06-13 MED ORDER — PROMETHAZINE HCL 25 MG/ML IJ SOLN: 6.2500 mg | INTRAMUSCULAR | Status: DC | PRN

## 2019-06-13 MED ORDER — BACITRACIN-NEOMYCIN-POLYMYXIN 400-5-5000 EX OINT: 1.0000 "application " | TOPICAL_OINTMENT | Freq: Three times a day (TID) | CUTANEOUS | Status: DC | PRN

## 2019-06-13 MED ORDER — SCOPOLAMINE 1 MG/3DAYS TD PT72
MEDICATED_PATCH | TRANSDERMAL | Status: AC
  Filled 2019-06-13: qty 1

## 2019-06-13 MED ORDER — DEXAMETHASONE SODIUM PHOSPHATE 10 MG/ML IJ SOLN
INTRAMUSCULAR | Status: DC | PRN
  Administered 2019-06-13: 10 mg via INTRAVENOUS

## 2019-06-13 MED ORDER — MIDAZOLAM HCL 2 MG/2ML IJ SOLN
INTRAMUSCULAR | Status: AC
  Filled 2019-06-13: qty 2

## 2019-06-13 MED ORDER — BUPIVACAINE LIPOSOME 1.3 % IJ SUSP
20.0000 mL | Freq: Once | INTRAMUSCULAR | Status: AC
  Administered 2019-06-13: 16:00:00 20 mL
  Filled 2019-06-13: qty 20

## 2019-06-13 MED ORDER — LACTATED RINGERS IV SOLN: INTRAVENOUS | Status: DC

## 2019-06-13 MED ORDER — DEXMEDETOMIDINE HCL 200 MCG/2ML IV SOLN
INTRAVENOUS | Status: DC | PRN
  Administered 2019-06-13 (×4): 8 ug via INTRAVENOUS

## 2019-06-13 MED ORDER — OXYBUTYNIN CHLORIDE 5 MG PO TABS
5.0000 mg | ORAL_TABLET | Freq: Three times a day (TID) | ORAL | Status: DC | PRN
  Administered 2019-06-13 – 2019-06-18 (×3): 5 mg via ORAL
  Filled 2019-06-13 (×3): qty 1

## 2019-06-13 SURGICAL SUPPLY — 62 items
ADH SKN CLS APL DERMABOND .7 (GAUZE/BANDAGES/DRESSINGS) ×1
APL PRP STRL LF DISP 70% ISPRP (MISCELLANEOUS) ×1
APL SWBSTK 6 STRL LF DISP (MISCELLANEOUS) ×1
APPLICATOR COTTON TIP 6 STRL (MISCELLANEOUS) ×1 IMPLANT
APPLICATOR COTTON TIP 6IN STRL (MISCELLANEOUS) ×2
CATH SILASTIC FOLEY 18FRX5CC (CATHETERS) ×1 IMPLANT
CATH SILASTIC FOLEY 22FRX30CC (CATHETERS) ×1 IMPLANT
CHLORAPREP W/TINT 26 (MISCELLANEOUS) ×2 IMPLANT
CLOTH BEACON ORANGE TIMEOUT ST (SAFETY) ×2 IMPLANT
COVER SURGICAL LIGHT HANDLE (MISCELLANEOUS) ×2 IMPLANT
COVER TIP SHEARS 8 DVNC (MISCELLANEOUS) ×1 IMPLANT
COVER TIP SHEARS 8MM DA VINCI (MISCELLANEOUS) ×1
COVER WAND RF STERILE (DRAPES) IMPLANT
DECANTER SPIKE VIAL GLASS SM (MISCELLANEOUS) ×2 IMPLANT
DERMABOND ADVANCED (GAUZE/BANDAGES/DRESSINGS) ×1
DERMABOND ADVANCED .7 DNX12 (GAUZE/BANDAGES/DRESSINGS) ×1 IMPLANT
DRAPE ARM DVNC X/XI (DISPOSABLE) ×4 IMPLANT
DRAPE COLUMN DVNC XI (DISPOSABLE) ×1 IMPLANT
DRAPE DA VINCI XI ARM (DISPOSABLE) ×4
DRAPE DA VINCI XI COLUMN (DISPOSABLE) ×1
DRAPE INCISE IOBAN 66X45 STRL (DRAPES) ×1 IMPLANT
DRAPE SURG IRRIG POUCH 19X23 (DRAPES) ×2 IMPLANT
DRSG TEGADERM 4X4.75 (GAUZE/BANDAGES/DRESSINGS) ×2 IMPLANT
ELECT PENCIL ROCKER SW 15FT (MISCELLANEOUS) ×1 IMPLANT
ELECT REM PT RETURN 15FT ADLT (MISCELLANEOUS) ×2 IMPLANT
GAUZE SPONGE 2X2 8PLY STRL LF (GAUZE/BANDAGES/DRESSINGS) ×1 IMPLANT
GLOVE BIOGEL PI IND STRL 6.5 (GLOVE) IMPLANT
GLOVE BIOGEL PI INDICATOR 6.5 (GLOVE) ×6
GLOVE SURG SS PI 8.0 STRL IVOR (GLOVE) ×2 IMPLANT
GOWN STRL REUS W/TWL LRG LVL3 (GOWN DISPOSABLE) ×6 IMPLANT
HOLDER FOLEY CATH W/STRAP (MISCELLANEOUS) ×2 IMPLANT
IRRIG SUCT STRYKERFLOW 2 WTIP (MISCELLANEOUS) ×2
IRRIGATION SUCT STRKRFLW 2 WTP (MISCELLANEOUS) ×1 IMPLANT
IV LACTATED RINGERS 1000ML (IV SOLUTION) ×2 IMPLANT
KIT TURNOVER KIT A (KITS) IMPLANT
NDL INSUFFLATION 14GA 120MM (NEEDLE) ×1 IMPLANT
NEEDLE INSUFFLATION 14GA 120MM (NEEDLE) ×2 IMPLANT
PACK ROBOT UROLOGY CUSTOM (CUSTOM PROCEDURE TRAY) ×2 IMPLANT
PAD POSITIONING PINK XL (MISCELLANEOUS) ×2 IMPLANT
SEAL CANN UNIV 5-8 DVNC XI (MISCELLANEOUS) ×4 IMPLANT
SEAL XI 5MM-8MM UNIVERSAL (MISCELLANEOUS) ×4
SET IRRIG Y TYPE TUR BLADDER L (SET/KITS/TRAYS/PACK) IMPLANT
SOLUTION ELECTROLUBE (MISCELLANEOUS) ×2 IMPLANT
SPONGE GAUZE 2X2 STER 10/PKG (GAUZE/BANDAGES/DRESSINGS)
SPONGE LAP 4X18 RFD (DISPOSABLE) ×1 IMPLANT
SUT ETHILON 3 0 PS 1 (SUTURE) ×2 IMPLANT
SUT MNCRL AB 4-0 PS2 18 (SUTURE) ×4 IMPLANT
SUT PDS AB 1 CT1 27 (SUTURE) ×1 IMPLANT
SUT V-LOC BARB 180 2/0GR6 GS22 (SUTURE) ×4
SUT VIC AB 0 CT1 27 (SUTURE) ×14
SUT VIC AB 0 CT1 27XBRD ANTBC (SUTURE) ×3 IMPLANT
SUT VIC AB 2-0 CT1 27 (SUTURE) ×2
SUT VIC AB 2-0 CT1 27XBRD (SUTURE) IMPLANT
SUT VIC AB 2-0 SH 27 (SUTURE)
SUT VIC AB 2-0 SH 27X BRD (SUTURE) IMPLANT
SUT VICRYL 0 UR6 27IN ABS (SUTURE) ×2 IMPLANT
SUT VLOC BARB 180 ABS3/0GR12 (SUTURE)
SUTURE V-LC BRB 180 2/0GR6GS22 (SUTURE) ×2 IMPLANT
SUTURE VLOC BRB 180 ABS3/0GR12 (SUTURE) IMPLANT
TOWEL OR NON WOVEN STRL DISP B (DISPOSABLE) ×2 IMPLANT
TROCAR 12M 150ML BLUNT (TROCAR) ×1 IMPLANT
WATER STERILE IRR 1000ML POUR (IV SOLUTION) ×2 IMPLANT

## 2019-06-13 NOTE — Op Note (Signed)
PREOPERATIVE DIAGNOSIS: BPH with urinary frequency and incomplete emptying  POSTOPERATIVE DIAGNOSIS: Same  PROCEDURES: 1. Robotic-assisted laparoscopic simple prostatectomy.  ANESTHESIA: General  ATTENDING: Nicolette Bang, MD  ASSISTANT: Clemetine Marker, PA  RESIDENT: none  ESTIMATED BLOOD LOSS: 100 mL.  COMPLICATIONS: None.  SPECIMEN: 1.prostatic adenoma  ANTIBIOTICS: ancef  FINDINGS: 2cm intravesical prostatic protrusion. Ureteral orifices in normal anatomic location. No leaks from cystotomy at 150cc of water.  The assistant was utilized for retraction, suction, and passing suture  DRAINS: 1. Jackson-Pratt drain to bulb suction. 2. Foley catheter to straight drain.  INDICATION: Juan [name] is a very pleasant 58 year old gentleman, who has BPH with significant LUTS including elevated PVR. His TRUS volume is 135cc.  Options were discussed with the patient in detail for primary manage including continued surveillance protocols versus surgical extirpation with and without minimally invasive assistance and he wished to proceed with robotic simple prostatectomy. Informed consent was obtained and placed in the medical record.  PROCEDURE IN DETAIL: The patient was brought to the operating room and a breif timeout was down to ensure correct patient, correct procedure, and correct site. Intravenous antibiotics were administered. General endotracheal anesthesia was introduced. The patient was placed into a low lithotomy position after tucking his arms with foam padding, placing on a pink and non-slide foam pad. A test of steep Trendelenburg positioning was performed and he was found to be suitably positioned. Sterile field was created by prepping and draping the patient's penis, perineum and proximal thighs using iodine and his infra-xiphoid abdomen using chlorhexidine gluconate. Next, a high-flow, low-pressure pneumoperitoneum was obtained using Veress technique in  the infraumbilical midline having passed the aspiration and drop test. Next, a 8-mm robotic camera port was placed in the same location. Laparoscopic examination of the peritoneal cavity revealed no significant adhesions and no visceral injury. Additional ports were placed as follows: Right paramedian 8-mm robotic port, right far lateral 12-mm assistant port, left paramedian 8-mm robotic port, left far lateral 8-mm robotic port, and right paramedian 5-mm suction port. Robot was docked and passed through the electronic checks. Next, attention was directed for the development of space of Retzius. Incision was made lateral to the left medial umbilical ligament from the midline towards the area of the internal ring and coursing along the iliac vessels towards the area of the ureter, which was positively identified. The left bladder was dissected away from the pelvic sidewall towards the area of the endopelvic fascia. A mirror-imaged dissection was performed on the right side.  Additional anterior attachments were taken down using cautery scissors. Next, the bladder neck was identified moving the Foley catheter back and forth. We then made a 6cm transverse cystotomy 3cm from the bladder neck. We identified the ureteral orifices and care was taken to exclude then from the dissection. We then placed 3 holding stitches in the anterior bladder wall and secured it to Coopers ligament.  We then made a circumscribing incision around the base of the prostate. We then used a 0 vicryl in a figure of eight fashion in the base of the adenoma for traction. We proceeded with a posterior dissection of the prostate until we identified the capsule. We then used a combination of electrocautery, blunt and sharp dissection to free the adenoma from the capsule. We then dissected laterally to the apex. Individual bleeders were cauterized. We then dissected anteriorly along the capsule until we reached the apex. The  adenoma was then freed and placed in an endocatch bag. We noted good hemostasis  and no additional sutures were placed. A Foley catheter was then placed per urethra easily. We then tacked down the bladder neck to the prostatic fossa with a single interrupted 2-0 vicryl. We then proceeded to closed the cystotomy. We closed the bladder with a running 0 vicryl full thickness. We then performed a imbricating second layer  Closure with 0 vicryl. The bladder was then filled with 150cc of water and we noted no leak.  All sponge and needle counts were correct. A closed suction drain was brought to the previous left lateral robotic port site into the area of the peritoneal cavity. The previous right 12-mm assistant port was closed at the level of the fascia using a Carter-Thomason suture passer under laparoscopic vision. Robot was undocked. Specimen was retrieved by extending the previous camera port site inferiorly for distance approximately 3 cm and removing the prostatectomy specimens and setting aside for permanent pathology. The site was closed at the level of fascia using figure-of-eight 0 vicryl followed by reapproximation of the Scarpa's using running Vicryl. All incision sites were infiltrated with dilute Lyophilized Marcaine and closed at the level of the skin using subcuticular Monocryl followed by Dermabond. Procedure was then terminated. The patient tolerated the procedure well. There were no immediate periprocedural complications and the patient was taken to the postanesthesia care unit in stable Condition.  COMPLICATIONS: None  CONDITION: Stable, extubated, transferred to PACU  PLAN: The patient will be admitted for 1-2 for hydration, post operative monitoring and pain control. He will be discharged home with foley in place and foley will be removed in 14 days, He will have a cystogram prior to foley catheter removal

## 2019-06-13 NOTE — Discharge Instructions (Signed)

## 2019-06-13 NOTE — Anesthesia Procedure Notes (Addendum)
Procedure Name: Intubation Date/Time: 06/13/2019 1:28 PM Performed by: West Pugh, CRNA Pre-anesthesia Checklist: Patient identified, Emergency Drugs available, Suction available, Patient being monitored and Timeout performed Patient Re-evaluated:Patient Re-evaluated prior to induction Oxygen Delivery Method: Circle system utilized Preoxygenation: Pre-oxygenation with 100% oxygen Induction Type: IV induction, Rapid sequence and Cricoid Pressure applied Ventilation: Mask ventilation without difficulty Laryngoscope Size: Mac and 4 Grade View: Grade III Tube type: Oral Tube size: 8.0 mm Number of attempts: 3 Airway Equipment and Method: Stylet Placement Confirmation: ETT inserted through vocal cords under direct vision,  positive ETCO2,  CO2 detector and breath sounds checked- equal and bilateral Secured at: 23 cm Tube secured with: Tape Dental Injury: Teeth and Oropharynx as per pre-operative assessment  Comments: glidescope in room. Attempted x 2 by CRNA without success. Attempt x 1 by Dr Smith Robert with success.

## 2019-06-13 NOTE — Anesthesia Postprocedure Evaluation (Signed)
Anesthesia Post Note  Patient: Juan [name]  Procedure(s) Performed: XI ROBOTIC ASSISTED SIMPLE PROSTATECTOMY (N/A )     Patient location during evaluation: PACU Anesthesia Type: General Level of consciousness: awake and alert Pain management: pain level controlled Vital Signs Assessment: post-procedure vital signs reviewed and stable Respiratory status: spontaneous breathing, nonlabored ventilation, respiratory function stable and patient connected to nasal cannula oxygen Cardiovascular status: blood pressure returned to baseline and stable Postop Assessment: no apparent nausea or vomiting Anesthetic complications: no    Last Vitals:  Vitals:   06/13/19 1745 06/13/19 1800  BP: (!) 181/111 (!) 155/80  Pulse: 67 64  Resp: 16 14  Temp:  37.1 C  SpO2: 98% 98%    Last Pain:  Vitals:   06/13/19 1800  TempSrc:   PainSc: 2                  Effie Berkshire

## 2019-06-13 NOTE — H&P (Signed)
Urology Admission H&P  Chief Complaint: Urinary frequency  History of Present Illness: Juan [name] is a 58yo with a history of BPH who has failed medical therapy. He underwent cystoscopy and was found to have an 8cm prostatic urethra and on prostate [name] his prostate was over 135cc. He has severe urinary urgency, frequency, nocturia and incomplete emptying despite alpha blocker therapy.  Past Medical History:  Diagnosis Date  . GERD (gastroesophageal reflux disease)    mild  . Hypercholesterolemia   . Hypertension   . PONV (postoperative nausea and vomiting)   . Sleep apnea    Past Surgical History:  Procedure Laterality Date  . CHOLECYSTECTOMY    . COLONOSCOPY  2017  . KNEE ARTHROSCOPY WITH MENISCAL REPAIR Right   . VASECTOMY    . WISDOM TOOTH EXTRACTION Bilateral     Home Medications:  Current Facility-Administered Medications  Medication Dose Route Frequency Provider Last Rate Last Dose  . bupivacaine liposome (EXPAREL) 1.3 % injection 266 mg  20 mL Infiltration Once Calani Gick, Candee Furbish, MD      . ceFAZolin (ANCEF) 3 g in dextrose 5 % 50 mL IVPB  3 g Intravenous 30 min Pre-Op Stefana Lodico, Candee Furbish, MD      . lactated ringers infusion   Intravenous Continuous Effie Berkshire, MD 50 mL/hr at 06/13/19 1055    . lactated ringers irrigation solution    PRN Cleon Gustin, MD   1,000 mL at 06/13/19 1226   Allergies:  Allergies  Allergen Reactions  . Codeine Swelling    Family History  Problem Relation Age of Onset  . Hypertension Mother   . Hypertension Father    Social History:  reports that he quit smoking about 21 years ago. His smoking use included cigarettes. He has a 5.00 pack-year smoking history. He has never used smokeless tobacco. He reports current alcohol use. He reports that he does not use drugs.  Review of Systems  Genitourinary: Positive for frequency and urgency.  All other systems reviewed and are negative.   Physical Exam:  Vital signs in last 24  hours: Temp:  [98.5 F (36.9 C)] 98.5 F (36.9 C) (08/06 1038) Pulse Rate:  [85] 85 (08/06 1038) Resp:  [18] 18 (08/06 1038) BP: (176)/(100) 176/100 (08/06 1038) SpO2:  [100 %] 100 % (08/06 1038) Physical Exam  Constitutional: He is oriented to person, place, and time. He appears well-developed and well-nourished.  HENT:  Head: Normocephalic and atraumatic.  Eyes: Pupils are equal, round, and reactive to light. EOM are normal.  Neck: Normal range of motion. No thyromegaly present.  Cardiovascular: Normal rate and regular rhythm.  Respiratory: Effort normal. No respiratory distress.  GI: Soft. He exhibits no distension.  Musculoskeletal: Normal range of motion.        General: No edema.  Neurological: He is alert and oriented to person, place, and time.  Skin: Skin is warm and dry.  Psychiatric: He has a normal mood and affect. His behavior is normal. Judgment and thought content normal.    Laboratory Data:  No results found for this or any previous visit (from the past 24 hour(s)). Recent Results (from the past 240 hour(s))  SARS CORONAVIRUS 2 Nasal Swab Aptima Multi Swab     Status: None   Collection Time: 06/10/19 12:22 PM   Specimen: Aptima Multi Swab; Nasal Swab  Result Value Ref Range Status   SARS Coronavirus 2 NEGATIVE NEGATIVE Final    Comment: (NOTE) SARS-CoV-2 target nucleic acids are  NOT DETECTED. The SARS-CoV-2 RNA is generally detectable in upper and lower respiratory specimens during the acute phase of infection. Negative results do not preclude SARS-CoV-2 infection, do not rule out co-infections with other pathogens, and should not be used as the sole basis for treatment or other patient management decisions. Negative results must be combined with clinical observations, patient history, and epidemiological information. The expected result is Negative. Fact Sheet for Patients: SugarRoll.be Fact Sheet for Healthcare  Providers: https://www.woods-mathews.com/ This test is not yet approved or cleared by the [place] FDA and  has been authorized for detection and/or diagnosis of SARS-CoV-2 by FDA under an Emergency Use Authorization (EUA). This EUA will remain  in effect (meaning this test can be used) for the duration of the COVID-19 declaration under Section 56 4(b)(1) of the Act, 21 U.S.C. section 360bbb-3(b)(1), unless the authorization is terminated or revoked sooner. Performed at [facility], [place] [address], [place], [place] [address]    Creatinine: Recent Labs    06/11/19 1501  CREATININE 1.30*   Baseline Creatinine: 1.3  Impression/Assessment:  57yo with BPH with LUTS, urinary frequency  Plan:  The risks/benefits/alterantives to robot assisted laparoscopic simple prostatectomy was explained to the patient and he understands and wishes to proceed with surgery  Nicolette Bang 06/13/2019, 1:00 PM

## 2019-06-13 NOTE — Progress Notes (Signed)
I was called to evaluate Mr. Juan [name] drainage. Nurse said she had to irrigate the foley at the beginning of the shift and since then CBI running well. Also through out shift, [name] output has been high.   Pt c/o abd pain but not severe. Also some bladder spasm.   . Vitals:   06/13/19 2028 06/13/19 2147  BP: (!) 163/104 (!) 155/82  Pulse: 67 (!) 57  Resp: 18 18  Temp: 98 F (36.7 C) 98 F (36.7 C)  SpO2: 98% 98%   CBC    Component Value Date/Time   WBC 6.2 06/11/2019 1501   RBC 5.42 06/11/2019 1501   HGB 16.0 06/13/2019 1622   HCT 52.2 (H) 06/13/2019 1622   PLT 186 06/11/2019 1501   MCV 91.5 06/11/2019 1501   MCH 28.0 06/11/2019 1501   MCHC 30.6 06/11/2019 1501   RDW 15.6 (H) 06/11/2019 1501    He's in NAD Watching TV, does not appear to be in pain, appears well  Abd - soft, NT, ND, port sites intact [name] with serosanguinous drainge Foley light red -- I irrigated the foley with 50-60 cc aliquots and got equal return. A few small clots. CBI on moderate gtt with pink urine.  Ext - scd in place   Post-op Robotic-assisted laparoscopic simple prostatectomy -   I sent another h/h. Vitals ok. Exam normal. Cont CBI. Monitor [name] output. Catheter irrigated normally.

## 2019-06-13 NOTE — Transfer of Care (Signed)
Immediate Anesthesia Transfer of Care Note  Patient: Juan [name]  Procedure(s) Performed: XI ROBOTIC ASSISTED SIMPLE PROSTATECTOMY (N/A )  Patient Location: PACU  Anesthesia Type:General  Level of Consciousness: awake, alert , oriented and patient cooperative  Airway & Oxygen Therapy: Patient Spontanous Breathing and Patient connected to face mask oxygen  Post-op Assessment: Report given to RN and Post -op Vital signs reviewed and stable  Post vital signs: Reviewed and stable  Last Vitals:  Vitals Value Taken Time  BP 188/105 06/13/19 1615  Temp    Pulse 79 06/13/19 1618  Resp 17 06/13/19 1618  SpO2 100 % 06/13/19 1618  Vitals shown include unvalidated device data.  Last Pain:  Vitals:   06/13/19 1038  TempSrc: Oral         Complications: No apparent anesthesia complications

## 2019-06-14 ENCOUNTER — Encounter (HOSPITAL_COMMUNITY): Payer: Self-pay | Admitting: Urology

## 2019-06-14 LAB — BASIC METABOLIC PANEL
Anion gap: 8 (ref 5–15)
BUN: 12 mg/dL (ref 6–20)
CO2: 23 mmol/L (ref 22–32)
Calcium: 8.4 mg/dL — ABNORMAL LOW (ref 8.9–10.3)
Chloride: 105 mmol/L (ref 98–111)
Creatinine, Ser: 1.45 mg/dL — ABNORMAL HIGH (ref 0.61–1.24)
GFR calc Af Amer: >60 mL/min (ref >60)
GFR calc non Af Amer: 54 mL/min — ABNORMAL LOW (ref >60)
Glucose, Bld: 157 mg/dL — ABNORMAL HIGH (ref 70–99)
Potassium: 5.1 mmol/L (ref 3.5–5.1)
Sodium: 136 mmol/L (ref 135–145)

## 2019-06-14 LAB — HEMOGLOBIN AND HEMATOCRIT, BLOOD
HCT: 45.4 % (ref 39.0–52.0)
Hemoglobin: 13.9 g/dL (ref 13.0–17.0)

## 2019-06-14 MED ORDER — BELLADONNA ALKALOIDS-OPIUM 16.2-60 MG RE SUPP
1.0000 | Freq: Three times a day (TID) | RECTAL | Status: DC | PRN
  Administered 2019-06-14: 11:00:00 1 via RECTAL
  Filled 2019-06-14: qty 1

## 2019-06-14 MED ORDER — KETOROLAC TROMETHAMINE 30 MG/ML IJ SOLN
30.0000 mg | Freq: Three times a day (TID) | INTRAMUSCULAR | Status: DC
  Administered 2019-06-14 – 2019-06-15 (×3): 30 mg via INTRAVENOUS
  Filled 2019-06-14 (×3): qty 1

## 2019-06-14 NOTE — Progress Notes (Signed)
1 Day Post-Op Subjective: The patient developed high JP output and required foley irrigation for several small clots. JP ouptu overnight was 3L. Creatinine normal hemoglobin stable. Mild abdominal pain. No flatus.  Objective: Vital signs in last 24 hours: Temp:  [98 F (36.7 C)-99.9 F (37.7 C)] 98 F (36.7 C) (08/07 1325) Pulse Rate:  [54-70] 68 (08/07 1325) Resp:  [11-20] 18 (08/07 1325) BP: (121-192)/(73-111) 121/75 (08/07 1325) SpO2:  [94 %-98 %] 97 % (08/07 1325) Weight:  [159.2 kg-159.8 kg] 159.2 kg (08/07 0510)  Intake/Output from previous day: 08/06 0701 - 08/07 0700 In: 29798 [P.O.:360; I.V.:2323; IV Piggyback:1050] Out: 92119 [Urine:32750; Drains:3065; Blood:100] Intake/Output this shift: Total I/O In: 7014.1 [P.O.:230; I.V.:784.1; Other:6000] Out: 4985 [Urine:3375; Drains:1610]  Physical Exam:  General:alert, cooperative and appears stated age GI: soft, non tender, normal bowel sounds, no palpable masses, no organomegaly, no inguinal hernia Male genitalia: not done Extremities: extremities normal, atraumatic, no cyanosis or edema  Lab Results: Recent Labs    06/13/19 1622 06/13/19 2320 06/14/19 0409  HGB 16.0 15.1 13.9  HCT 52.2* 48.4 45.4   BMET Recent Labs    06/14/19 0409  NA 136  K 5.1  CL 105  CO2 23  GLUCOSE 157*  BUN 12  CREATININE 1.45*  CALCIUM 8.4*   No results for input(s): LABPT, INR in the last 72 hours. No results for input(s): LABURIN in the last 72 hours. Results for orders placed or performed during the hospital encounter of 06/10/19  SARS CORONAVIRUS 2 Nasal Swab Aptima Multi Swab     Status: None   Collection Time: 06/10/19 12:22 PM   Specimen: Aptima Multi Swab; Nasal Swab  Result Value Ref Range Status   SARS Coronavirus 2 NEGATIVE NEGATIVE Final    Comment: (NOTE) SARS-CoV-2 target nucleic acids are NOT DETECTED. The SARS-CoV-2 RNA is generally detectable in upper and lower respiratory specimens during the acute phase of  infection. Negative results do not preclude SARS-CoV-2 infection, do not rule out co-infections with other pathogens, and should not be used as the sole basis for treatment or other patient management decisions. Negative results must be combined with clinical observations, patient history, and epidemiological information. The expected result is Negative. Fact Sheet for Patients: SugarRoll.be Fact Sheet for Healthcare Providers: https://www.woods-mathews.com/ This test is not yet approved or cleared by the [place] FDA and  has been authorized for detection and/or diagnosis of SARS-CoV-2 by FDA under an Emergency Use Authorization (EUA). This EUA will remain  in effect (meaning this test can be used) for the duration of the COVID-19 declaration under Section 56 4(b)(1) of the Act, 21 U.S.C. section 360bbb-3(b)(1), unless the authorization is terminated or revoked sooner. Performed at [facility], [place] [address], [place], [place] [address]     Studies/Results: No results found.  Assessment/Plan: POD#1 robotic simple prostatectomy 1. Wean CBI to off 2. Foley placed on traction 3. Continue to monitor JP output. If it continues to have high output and CBI is off the JP will be repositioned and then taken off of suction 4.  Continue clear liquid diet.    LOS: 1 day   Nicolette Bang 06/14/2019, 5:11 PM

## 2019-06-14 NOTE — Progress Notes (Signed)
Spoke with Pt regarding CPAP.  Pt states that he uses it at home and wants to use it here but is currently not able to sleep due to pain and spasms from surgery.  CPAP placed in room (Pt has his nasal pillows and tubing) and ready to be set up for Pt use.  Pt states he will page out to the RN when he is ready for CPAP so that his RN may contact RT.

## 2019-06-14 NOTE — Progress Notes (Signed)
Upon assessment pt complains of pressure to bladder region. Ordered Ditropan given to pt. Foley noted to be clotted. On call provider Ronal Fear, NP made aware. Order given to hand irrigate. Pt with relief after irrigation of foley catheter. CBI restarted at fast rate and is now flowing. Will continue to monitor closely.

## 2019-06-14 NOTE — Progress Notes (Signed)
JP noted to have significant bloody output since beginning of shift. Total of 830 ml emptied within two hours. VSS. Pt does not complain of any new onset of pain other than occasional bladder spasms. Abd soft. On call provider made aware of findings. MD Eskridge at bedside to assess pt. Will continue to monitor closely.

## 2019-06-15 LAB — CBC
HCT: 43.6 % (ref 39.0–52.0)
Hemoglobin: 13.2 g/dL (ref 13.0–17.0)
MCH: 27.9 pg (ref 26.0–34.0)
MCHC: 30.3 g/dL (ref 30.0–36.0)
MCV: 92.2 fL (ref 80.0–100.0)
Platelets: 190 10*3/uL (ref 150–400)
RBC: 4.73 MIL/uL (ref 4.22–5.81)
RDW: 15.5 % (ref 11.5–15.5)
WBC: 14.2 10*3/uL — ABNORMAL HIGH (ref 4.0–10.5)
nRBC: 0 % (ref 0.0–0.2)

## 2019-06-15 LAB — CREATININE, SERUM
Creatinine, Ser: 1.26 mg/dL — ABNORMAL HIGH (ref 0.61–1.24)
GFR calc Af Amer: >60 mL/min (ref >60)
GFR calc non Af Amer: >60 mL/min (ref >60)

## 2019-06-15 MED ORDER — HEPARIN SODIUM (PORCINE) 5000 UNIT/ML IJ SOLN
5000.0000 [IU] | Freq: Three times a day (TID) | INTRAMUSCULAR | Status: DC
  Administered 2019-06-15 – 2019-06-20 (×15): 5000 [IU] via SUBCUTANEOUS
  Filled 2019-06-15 (×15): qty 1

## 2019-06-15 MED ORDER — MENTHOL 3 MG MT LOZG
1.0000 | LOZENGE | OROMUCOSAL | Status: DC | PRN
  Filled 2019-06-15: qty 9

## 2019-06-15 NOTE — Progress Notes (Signed)
2 Days Post-Op Subjective: JP 2.5L in 24 hours but has significantly decreased this morning/. Patient required clot irrigation this morning and now urine is light pink on slow drip CBI. Creatinine normal hemoglobin stable. Mild abdominal pain. Positive flatus this morning  Objective: Vital signs in last 24 hours: Temp:  [98 F (36.7 C)-99.1 F (37.3 C)] 98.3 F (36.8 C) (08/08 0653) Pulse Rate:  [63-80] 80 (08/08 0653) Resp:  [18-19] 18 (08/08 0653) BP: (121-190)/(72-91) 146/78 (08/08 0653) SpO2:  [92 %-97 %] 95 % (08/08 0653)  Intake/Output from previous day: 08/07 0701 - 08/08 0700 In: 10314.2 [P.O.:830; I.V.:2284.2] Out: 9335 [Urine:6775; Drains:2560] Intake/Output this shift: Total I/O In: -  Out: 800 [Urine:800]  Physical Exam:  General:alert, cooperative and appears stated age GI: soft, non tender, normal bowel sounds, no palpable masses, no organomegaly, no inguinal hernia Male genitalia: not done Extremities: extremities normal, atraumatic, no cyanosis or edema  Lab Results: Recent Labs    06/13/19 2320 06/14/19 0409 06/15/19 0854  HGB 15.1 13.9 13.2  HCT 48.4 45.4 43.6   BMET Recent Labs    06/14/19 0409 06/15/19 0854  NA 136  --   K 5.1  --   CL 105  --   CO2 23  --   GLUCOSE 157*  --   BUN 12  --   CREATININE 1.45* 1.26*  CALCIUM 8.4*  --    No results for input(s): LABPT, INR in the last 72 hours. No results for input(s): LABURIN in the last 72 hours. Results for orders placed or performed during the hospital encounter of 06/10/19  SARS CORONAVIRUS 2 Nasal Swab Aptima Multi Swab     Status: None   Collection Time: 06/10/19 12:22 PM   Specimen: Aptima Multi Swab; Nasal Swab  Result Value Ref Range Status   SARS Coronavirus 2 NEGATIVE NEGATIVE Final    Comment: (NOTE) SARS-CoV-2 target nucleic acids are NOT DETECTED. The SARS-CoV-2 RNA is generally detectable in upper and lower respiratory specimens during the acute phase of infection.  Negative results do not preclude SARS-CoV-2 infection, do not rule out co-infections with other pathogens, and should not be used as the sole basis for treatment or other patient management decisions. Negative results must be combined with clinical observations, patient history, and epidemiological information. The expected result is Negative. Fact Sheet for Patients: SugarRoll.be Fact Sheet for Healthcare Providers: https://www.woods-mathews.com/ This test is not yet approved or cleared by the [place] FDA and  has been authorized for detection and/or diagnosis of SARS-CoV-2 by FDA under an Emergency Use Authorization (EUA). This EUA will remain  in effect (meaning this test can be used) for the duration of the COVID-19 declaration under Section 56 4(b)(1) of the Act, 21 U.S.C. section 360bbb-3(b)(1), unless the authorization is terminated or revoked sooner. Performed at [facility], [place] [address], [place], [place] [address]     Studies/Results: No results found.  Assessment/Plan: POD#2 robotic simple prostatectomy 1. Wean CBI to off 2. Continue foley on traction 3. Continue to monitor JP output. If it continues to have high output and CBI is off the JP will be repositioned and then taken off of suction 4.  Continue clear liquid diet and advance diet as tolerated    LOS: 2 days   Nicolette Bang 06/15/2019, 10:19 AM

## 2019-06-15 NOTE — Plan of Care (Signed)
  Problem: Clinical Measurements: Goal: Postoperative complications will be avoided or minimized Outcome: Progressing   Problem: Activity: Goal: Risk for activity intolerance will decrease Outcome: Progressing   Problem: Pain Managment: Goal: General experience of comfort will improve Outcome: Not Progressing

## 2019-06-15 NOTE — Progress Notes (Signed)
Pt states that he will call if he decides to wear CPAP QHS.  RT to monitor and assess as needed.

## 2019-06-16 LAB — CREATININE, FLUID (PLEURAL, PERITONEAL, JP DRAINAGE): Creat, Fluid: 33.8 mg/dL

## 2019-06-16 MED ORDER — KETOROLAC TROMETHAMINE 15 MG/ML IJ SOLN
15.0000 mg | Freq: Four times a day (QID) | INTRAMUSCULAR | Status: DC | PRN
  Administered 2019-06-16 – 2019-06-19 (×3): 15 mg via INTRAVENOUS
  Filled 2019-06-16 (×3): qty 1

## 2019-06-16 MED ORDER — BISACODYL 10 MG RE SUPP
10.0000 mg | Freq: Every day | RECTAL | Status: DC | PRN
  Administered 2019-06-16 – 2019-06-17 (×2): 10 mg via RECTAL
  Filled 2019-06-16 (×2): qty 1

## 2019-06-16 MED ORDER — MORPHINE SULFATE (PF) 4 MG/ML IV SOLN
4.0000 mg | INTRAVENOUS | Status: DC | PRN
  Administered 2019-06-16 – 2019-06-17 (×5): 4 mg via INTRAVENOUS
  Filled 2019-06-16 (×5): qty 1

## 2019-06-16 MED ORDER — HYDROCODONE-ACETAMINOPHEN 5-325 MG PO TABS
1.0000 | ORAL_TABLET | ORAL | Status: DC | PRN
  Administered 2019-06-16 – 2019-06-20 (×13): 1 via ORAL
  Filled 2019-06-16 (×16): qty 1

## 2019-06-16 MED ORDER — SENNOSIDES-DOCUSATE SODIUM 8.6-50 MG PO TABS
2.0000 | ORAL_TABLET | Freq: Every day | ORAL | Status: DC
  Administered 2019-06-16 – 2019-06-18 (×3): 2 via ORAL
  Filled 2019-06-16 (×4): qty 2

## 2019-06-16 NOTE — Progress Notes (Addendum)
3 Days Post-Op s/p simple prostatectomy   Subjective: JP output is variable. Slowed early AM but then picked up this afternoon. Needed to put JP to gravity with wound manager to manage leaking after patient soaked 3 towels with JP output.  No flatus or BM today. Uncomfortable. Pain not controlled with Ultram. Jp Cr confirmed urine leak (33)   Objective: Vital signs in last 24 hours: Temp:  [98.8 F (37.1 C)-99.6 F (37.6 C)] 98.8 F (37.1 C) (08/09 0513) Pulse Rate:  [78-90] 82 (08/09 0513) Resp:  [18-20] 18 (08/09 0513) BP: (151-163)/(79-101) 151/101 (08/09 0513) SpO2:  [92 %] 92 % (08/09 0513)  Intake/Output from previous day: 08/08 0701 - 08/09 0700 In: 6735 [P.O.:1440; I.V.:345] Out: 6220 [Urine:5625; Drains:595] Intake/Output this shift: Total I/O In: 2400 [Other:2400] Out: 920 [Urine:325; Drains:595]  Physical Exam:  General:alert, cooperative and appears stated age GI: soft, non tender, normal bowel sounds, no palpable masses, no organomegaly, no inguinal hernia Male genitalia: not done Extremities: extremities normal, atraumatic, no cyanosis or edema  GU: Foley light red off CBI. JP dark red with wound manager.   Lab Results: Recent Labs    06/13/19 2320 06/14/19 0409 06/15/19 0854  HGB 15.1 13.9 13.2  HCT 48.4 45.4 43.6   BMET Recent Labs    06/14/19 0409 06/15/19 0854  NA 136  --   K 5.1  --   CL 105  --   CO2 23  --   GLUCOSE 157*  --   BUN 12  --   CREATININE 1.45* 1.26*  CALCIUM 8.4*  --    No results for input(s): LABPT, INR in the last 72 hours. No results for input(s): LABURIN in the last 72 hours. Results for orders placed or performed during the hospital encounter of 06/10/19  SARS CORONAVIRUS 2 Nasal Swab Aptima Multi Swab     Status: None   Collection Time: 06/10/19 12:22 PM   Specimen: Aptima Multi Swab; Nasal Swab  Result Value Ref Range Status   SARS Coronavirus 2 NEGATIVE NEGATIVE Final    Comment: (NOTE) SARS-CoV-2 target  nucleic acids are NOT DETECTED. The SARS-CoV-2 RNA is generally detectable in upper and lower respiratory specimens during the acute phase of infection. Negative results do not preclude SARS-CoV-2 infection, do not rule out co-infections with other pathogens, and should not be used as the sole basis for treatment or other patient management decisions. Negative results must be combined with clinical observations, patient history, and epidemiological information. The expected result is Negative. Fact Sheet for Patients: SugarRoll.be Fact Sheet for Healthcare Providers: https://www.woods-mathews.com/ This test is not yet approved or cleared by the [place] FDA and  has been authorized for detection and/or diagnosis of SARS-CoV-2 by FDA under an Emergency Use Authorization (EUA). This EUA will remain  in effect (meaning this test can be used) for the duration of the COVID-19 declaration under Section 56 4(b)(1) of the Act, 21 U.S.C. section 360bbb-3(b)(1), unless the authorization is terminated or revoked sooner. Performed at [facility], [place] [address], [place], [place] [address]     Studies/Results: No results found.  Assessment/Plan: POD#3 robotic simple prostatectomy -CBI off -Wound manager for JP output tracking and patient comfort. JP to gravity -Regular diet -Bowel regimen -DC Ultram.  Start Toradol as needed Vicodin as needed, morphine as needed  -Will stay today to get pain under control, monitor bowel function, monitor output from JP.      LOS: 3 days   Tharon Aquas  06/16/2019, 1:11 PM

## 2019-06-16 NOTE — Progress Notes (Signed)
Pt states he will call if he decides to wear CPAP QHS.  RT to monitor and assess as needed.

## 2019-06-17 ENCOUNTER — Inpatient Hospital Stay (HOSPITAL_COMMUNITY): Payer: 59

## 2019-06-17 ENCOUNTER — Encounter (HOSPITAL_COMMUNITY): Payer: Self-pay | Admitting: Radiology

## 2019-06-17 MED ORDER — SODIUM CHLORIDE (PF) 0.9 % IJ SOLN
INTRAMUSCULAR | Status: AC
  Filled 2019-06-17: qty 50

## 2019-06-17 MED ORDER — IOHEXOL 300 MG/ML  SOLN
100.0000 mL | Freq: Once | INTRAMUSCULAR | Status: AC | PRN
  Administered 2019-06-17: 100 mL via INTRAVENOUS

## 2019-06-17 NOTE — Progress Notes (Signed)
Pt states he will call if he decides to wear CPAP QHS.  Machine remains at bedside ready, RT to monitor and assess as needed.

## 2019-06-17 NOTE — Progress Notes (Signed)
4 Days Post-Op s/p simple prostatectomy   Subjective: JP output decreasing. CT from today shows small right sides leak. Positive flatus. Tolerating diet   Objective: Vital signs in last 24 hours: Temp:  [97.8 F (36.6 C)-99.8 F (37.7 C)] 99.8 F (37.7 C) (08/10 1433) Pulse Rate:  [86-102] 99 (08/10 1433) Resp:  [15-19] 15 (08/10 1311) BP: (149-186)/(85-100) 159/90 (08/10 1433) SpO2:  [92 %-98 %] 98 % (08/10 1433) Weight:  [155 kg] 155 kg (08/10 0444)  Intake/Output from previous day: 08/09 0701 - 08/10 0700 In: 2640 [P.O.:240] Out: 2570 [Urine:625; Drains:1945] Intake/Output this shift: Total I/O In: -  Out: 1260 [Urine:800; Drains:460]  Physical Exam:  General:alert, cooperative and appears stated age GI: soft, non tender, normal bowel sounds, no palpable masses, no organomegaly, no inguinal hernia Male genitalia: not done Extremities: extremities normal, atraumatic, no cyanosis or edema  GU: Foley light red off CBI. JP dark red with wound manager.   Lab Results: Recent Labs    06/15/19 0854  HGB 13.2  HCT 43.6   BMET Recent Labs    06/15/19 0854  CREATININE 1.26*   No results for input(s): LABPT, INR in the last 72 hours. No results for input(s): LABURIN in the last 72 hours. Results for orders placed or performed during the hospital encounter of 06/10/19  SARS CORONAVIRUS 2 Nasal Swab Aptima Multi Swab     Status: None   Collection Time: 06/10/19 12:22 PM   Specimen: Aptima Multi Swab; Nasal Swab  Result Value Ref Range Status   SARS Coronavirus 2 NEGATIVE NEGATIVE Final    Comment: (NOTE) SARS-CoV-2 target nucleic acids are NOT DETECTED. The SARS-CoV-2 RNA is generally detectable in upper and lower respiratory specimens during the acute phase of infection. Negative results do not preclude SARS-CoV-2 infection, do not rule out co-infections with other pathogens, and should not be used as the sole basis for treatment or other patient management  decisions. Negative results must be combined with clinical observations, patient history, and epidemiological information. The expected result is Negative. Fact Sheet for Patients: SugarRoll.be Fact Sheet for Healthcare Providers: https://www.woods-mathews.com/ This test is not yet approved or cleared by the [place] FDA and  has been authorized for detection and/or diagnosis of SARS-CoV-2 by FDA under an Emergency Use Authorization (EUA). This EUA will remain  in effect (meaning this test can be used) for the duration of the COVID-19 declaration under Section 56 4(b)(1) of the Act, 21 U.S.C. section 360bbb-3(b)(1), unless the authorization is terminated or revoked sooner. Performed at [facility], [place] [address], [place], [place] [address]     Studies/Results: Ct Abdomen Pelvis W Wo Contrast  Result Date: 06/17/2019 CLINICAL DATA:  Low abdominal pain with nausea and hematuria since prostatectomy 06/13/2019 for BPH. EXAM: CT ABDOMEN AND PELVIS WITHOUT AND WITH CONTRAST TECHNIQUE: Multidetector CT imaging of the abdomen and pelvis was performed following the standard protocol before and following the bolus administration of intravenous contrast. CONTRAST:  176mL OMNIPAQUE IOHEXOL 300 MG/ML  SOLN COMPARISON:  None. FINDINGS: Lower chest: Moderate subsegmental atelectasis at both lung bases. No significant pleural or pericardial effusion. Hepatobiliary: Pre contrast images demonstrate decreased hepatic density suspicious for mild steatosis. Following contrast, no abnormal enhancement or focal hepatic lesion identified. No significant biliary dilatation post cholecystectomy. Pancreas: Unremarkable. No pancreatic ductal dilatation or surrounding inflammatory changes. Spleen: Normal in size without focal abnormality. Adrenals/Urinary Tract: Both adrenal glands appear normal. Pre-contrast images demonstrate no renal, ureteral or bladder calculi.  Post-contrast,  both kidneys enhance normally. There is no evidence of enhancing renal mass. There are small left renal cysts. Delayed images result in segmental visualization of the ureters. No focal upper tract urothelial abnormalities are identified. A Foley catheter is in place. There is mild bladder wall thickening. Delayed images through the pelvis demonstrate leakage of contrast from the bladder on the right with contrast accumulating around the surgical drain in the right lower quadrant. Stomach/Bowel: There is mild diffuse small and large bowel distension, most consistent with a postoperative ileus. No focal bowel wall thickening or surrounding inflammation. Vascular/Lymphatic: There are no enlarged abdominal or pelvic lymph nodes. No significant vascular findings. Minimal aortoiliac atherosclerosis. Reproductive: Foley catheter is in place. Soft tissue thickening around the bladder base may represent residual prostatic tissue and/or hematoma. Other: There is a left sided approach pelvic drain with tip in the right pelvis. There is a small amount of fluid surrounding this drain with progressive contrast accumulation on the delayed images from the bladder leak described above. There is a small amount extraluminal air beneath the anterior abdominal wall attributed to the recent surgery. No significant ascites. Musculoskeletal: No acute or significant osseous findings. Lower lumbar spondylosis, greatest at L5-S1. IMPRESSION: 1. There is a small right-sided bladder perforation with leaking contrast-enhanced urine accumulating around the surgical drain in the right lower quadrant. No evidence of ureteral injury or ascites. 2. Soft tissue thickening at the bladder base surrounding the Foley catheter may reflect residual prostatic tissue and/or hematoma post prostatectomy. 3. Mild generalized dilatation of the small and large bowel most consistent with a postoperative ileus. 4. Additional incidental findings  including bibasilar atelectasis, left renal cysts and mild hepatic steatosis. 5. These results were called by telephone at the time of interpretation on 06/17/2019 at 1:06 pm to Dr. Nicolette Bang , who verbally acknowledged these results. Electronically Signed   By: Richardean Sale M.D.   On: 06/17/2019 13:07    Assessment/Plan: POD#4 robotic simple prostatectomy  1. JP to gravity drain and drain repositioned.  2. Advance diet as tolerated     LOS: 4 days   Nicolette Bang 06/17/2019, 3:01 PM

## 2019-06-18 NOTE — Progress Notes (Signed)
5 Days Post-Op s/p simple prostatectomy   Subjective: JP output decreasing. Positive flatus. Tolerating diet   Objective: Vital signs in last 24 hours: Temp:  [97.9 F (36.6 C)-99.5 F (37.5 C)] 97.9 F (36.6 C) (08/11 2039) Pulse Rate:  [69-96] 69 (08/11 2039) Resp:  [16-18] 16 (08/11 2039) BP: (131-149)/(78-84) 137/80 (08/11 2039) SpO2:  [94 %-98 %] 95 % (08/11 2039)  Intake/Output from previous day: 08/10 0701 - 08/11 0700 In: 480 [P.O.:480] Out: 3335 [Urine:2125; Drains:1210] Intake/Output this shift: No intake/output data recorded.  Physical Exam:  General:alert, cooperative and appears stated age GI: soft, non tender, normal bowel sounds, no palpable masses, no organomegaly, no inguinal hernia Male genitalia: not done Extremities: extremities normal, atraumatic, no cyanosis or edema  GU: Foley light red off CBI. JP dark red with wound manager.   Lab Results: No results for input(s): HGB, HCT in the last 72 hours. BMET No results for input(s): NA, K, CL, CO2, GLUCOSE, BUN, CREATININE, CALCIUM in the last 72 hours. No results for input(s): LABPT, INR in the last 72 hours. No results for input(s): LABURIN in the last 72 hours. Results for orders placed or performed during the hospital encounter of 06/10/19  SARS CORONAVIRUS 2 Nasal Swab Aptima Multi Swab     Status: None   Collection Time: 06/10/19 12:22 PM   Specimen: Aptima Multi Swab; Nasal Swab  Result Value Ref Range Status   SARS Coronavirus 2 NEGATIVE NEGATIVE Final    Comment: (NOTE) SARS-CoV-2 target nucleic acids are NOT DETECTED. The SARS-CoV-2 RNA is generally detectable in upper and lower respiratory specimens during the acute phase of infection. Negative results do not preclude SARS-CoV-2 infection, do not rule out co-infections with other pathogens, and should not be used as the sole basis for treatment or other patient management decisions. Negative results must be combined with clinical  observations, patient history, and epidemiological information. The expected result is Negative. Fact Sheet for Patients: SugarRoll.be Fact Sheet for Healthcare Providers: https://www.woods-mathews.com/ This test is not yet approved or cleared by the [place] FDA and  has been authorized for detection and/or diagnosis of SARS-CoV-2 by FDA under an Emergency Use Authorization (EUA). This EUA will remain  in effect (meaning this test can be used) for the duration of the COVID-19 declaration under Section 56 4(b)(1) of the Act, 21 U.S.C. section 360bbb-3(b)(1), unless the authorization is terminated or revoked sooner. Performed at [facility], [place] [address], [place], [place] [address]     Studies/Results: Ct Abdomen Pelvis W Wo Contrast  Result Date: 06/17/2019 CLINICAL DATA:  Low abdominal pain with nausea and hematuria since prostatectomy 06/13/2019 for BPH. EXAM: CT ABDOMEN AND PELVIS WITHOUT AND WITH CONTRAST TECHNIQUE: Multidetector CT imaging of the abdomen and pelvis was performed following the standard protocol before and following the bolus administration of intravenous contrast. CONTRAST:  160mL OMNIPAQUE IOHEXOL 300 MG/ML  SOLN COMPARISON:  None. FINDINGS: Lower chest: Moderate subsegmental atelectasis at both lung bases. No significant pleural or pericardial effusion. Hepatobiliary: Pre contrast images demonstrate decreased hepatic density suspicious for mild steatosis. Following contrast, no abnormal enhancement or focal hepatic lesion identified. No significant biliary dilatation post cholecystectomy. Pancreas: Unremarkable. No pancreatic ductal dilatation or surrounding inflammatory changes. Spleen: Normal in size without focal abnormality. Adrenals/Urinary Tract: Both adrenal glands appear normal. Pre-contrast images demonstrate no renal, ureteral or bladder calculi. Post-contrast, both kidneys enhance normally. There is no  evidence of enhancing renal mass. There are small left renal cysts. Delayed images  result in segmental visualization of the ureters. No focal upper tract urothelial abnormalities are identified. A Foley catheter is in place. There is mild bladder wall thickening. Delayed images through the pelvis demonstrate leakage of contrast from the bladder on the right with contrast accumulating around the surgical drain in the right lower quadrant. Stomach/Bowel: There is mild diffuse small and large bowel distension, most consistent with a postoperative ileus. No focal bowel wall thickening or surrounding inflammation. Vascular/Lymphatic: There are no enlarged abdominal or pelvic lymph nodes. No significant vascular findings. Minimal aortoiliac atherosclerosis. Reproductive: Foley catheter is in place. Soft tissue thickening around the bladder base may represent residual prostatic tissue and/or hematoma. Other: There is a left sided approach pelvic drain with tip in the right pelvis. There is a small amount of fluid surrounding this drain with progressive contrast accumulation on the delayed images from the bladder leak described above. There is a small amount extraluminal air beneath the anterior abdominal wall attributed to the recent surgery. No significant ascites. Musculoskeletal: No acute or significant osseous findings. Lower lumbar spondylosis, greatest at L5-S1. IMPRESSION: 1. There is a small right-sided bladder perforation with leaking contrast-enhanced urine accumulating around the surgical drain in the right lower quadrant. No evidence of ureteral injury or ascites. 2. Soft tissue thickening at the bladder base surrounding the Foley catheter may reflect residual prostatic tissue and/or hematoma post prostatectomy. 3. Mild generalized dilatation of the small and large bowel most consistent with a postoperative ileus. 4. Additional incidental findings including bibasilar atelectasis, left renal cysts and mild  hepatic steatosis. 5. These results were called by telephone at the time of interpretation on 06/17/2019 at 1:06 pm to Dr. Nicolette Bang , who verbally acknowledged these results. Electronically Signed   By: Richardean Sale M.D.   On: 06/17/2019 13:07    Assessment/Plan: POD#5 robotic simple prostatectomy  1. JP to gravity drain .  2. Advance diet as tolerated     LOS: 5 days   Nicolette Bang 06/18/2019, 11:05 PM

## 2019-06-18 NOTE — Progress Notes (Signed)
Patient with temp of 99.6 oral. RN has encouraged the patient to ambulate at least 4 times a day in the hallways and to use the incentive spirometer at the bedside. RN explained to that the patient that he needs to do these activities to prevent blood clots and pneumonia. Patient has only ambulated once today in the halls even after multiple requests by the RN. Will continue to encourage patient to mobilize and use IS.   Darel Ricketts, Fraser Din 06/18/2019 3:44 PM

## 2019-06-19 NOTE — Progress Notes (Signed)
JP drain output 2ml during 12h shift. Pt walked in hallway twice and ambulated in room. Pt reports having a large BM today and continues to pass gas.

## 2019-06-19 NOTE — Progress Notes (Signed)
Patient has packed his home CPAP supplies and asks that the machine be removed from his room. He denies continuous compliance during the night and wishes not to wear it tonight. Order changed to prn per RT protocol.

## 2019-06-20 NOTE — Progress Notes (Signed)
JP drain removed. Foley care teaching reviewed with patient along discharge instructions. All questions answered. Pt d/ced with all patient belongings.

## 2019-06-24 NOTE — Discharge Summary (Signed)
Physician Discharge Summary  Patient ID: Juan [name] MRN: [id number] DOB/AGE: October 31, 1961 58 y.o.  Admit date: 06/13/2019 Discharge date: 06/24/2019  Admission Diagnoses:  <principal problem not specified>  Discharge Diagnoses:  Active Problems:   BPH with obstruction/lower urinary tract symptoms   Past Medical History:  Diagnosis Date  . GERD (gastroesophageal reflux disease)    mild  . Hypercholesterolemia   . Hypertension   . PONV (postoperative nausea and vomiting)   . Sleep apnea     Surgeries: Procedure(s): XI ROBOTIC ASSISTED SIMPLE PROSTATECTOMY on 06/13/2019   Consultants (if any):   Discharged Condition: Improved  Hospital Course: Juan [name] is an 58 y.o. male who was admitted 06/13/2019 with a diagnosis of BPH with urinary frequency and urgency and went to the operating room on 06/13/2019 and underwent the above named procedures.   He developed a urine leak on POD#1 which was confirmed with CT. The leak was managed conservatively and on POD#5 the JP output became minimal. The JP was removed prior to discharge  He was given perioperative antibiotics:  Anti-infectives (From admission, onward)   Start     Dose/Rate Route Frequency Ordered Stop   06/13/19 0600  ceFAZolin (ANCEF) 3 g in dextrose 5 % 50 mL IVPB     3 g 100 mL/hr over 30 Minutes Intravenous 30 min pre-op 06/12/19 1027 06/13/19 1359   06/13/19 0000  sulfamethoxazole-trimethoprim (BACTRIM DS) 800-160 MG tablet     1 tablet Oral 2 times daily 06/13/19 1122      .  He was given sequential compression devices, early ambulation for DVT prophylaxis.  He benefited maximally from the hospital stay and there were no complications.    Recent vital signs:  Vitals:   06/19/19 2143 06/20/19 0605  BP: 132/76 (!) 143/85  Pulse: 75 71  Resp: 16 18  Temp: 99.7 F (37.6 C) 98.1 F (36.7 C)  SpO2: 94% 97%    Recent laboratory studies:  Lab Results  Component Value Date   HGB 13.2 06/15/2019   HGB  13.9 06/14/2019   HGB 15.1 06/13/2019   Lab Results  Component Value Date   WBC 14.2 (H) 06/15/2019   PLT 190 06/15/2019   No results found for: INR Lab Results  Component Value Date   NA 136 06/14/2019   K 5.1 06/14/2019   CL 105 06/14/2019   CO2 23 06/14/2019   BUN 12 06/14/2019   CREATININE 1.26 (H) 06/15/2019   GLUCOSE 157 (H) 06/14/2019    Discharge Medications:   Allergies as of 06/20/2019      Reactions   Codeine Swelling      Medication List    STOP taking these medications   ibuprofen 200 MG tablet Commonly known as: ADVIL     TAKE these medications   lisinopril 20 MG tablet Commonly known as: ZESTRIL Take 20 mg by mouth daily.   omeprazole 20 MG capsule Commonly known as: PRILOSEC Take 20 mg by mouth daily as needed (acid reflux).   oxymetazoline 0.05 % nasal spray Commonly known as: AFRIN Place 1 spray into both nostrils 2 (two) times daily as needed for congestion.   pravastatin 40 MG tablet Commonly known as: PRAVACHOL Take 40 mg by mouth at bedtime.   sulfamethoxazole-trimethoprim 800-160 MG tablet Commonly known as: BACTRIM DS Take 1 tablet by mouth 2 (two) times daily. Start the day prior to foley removal appointment   traMADol 50 MG tablet Commonly known as: Ultram Take 1-2 tablets (  50-100 mg total) by mouth every 6 (six) hours as needed for moderate pain or severe pain.   ZzzQuil 25 MG Caps Generic drug: diphenhydrAMINE HCl (Sleep) Take 25 mg by mouth at bedtime as needed (sleep).       Diagnostic Studies: Ct Abdomen Pelvis W Wo Contrast  Result Date: 06/17/2019 CLINICAL DATA:  Low abdominal pain with nausea and hematuria since prostatectomy 06/13/2019 for BPH. EXAM: CT ABDOMEN AND PELVIS WITHOUT AND WITH CONTRAST TECHNIQUE: Multidetector CT imaging of the abdomen and pelvis was performed following the standard protocol before and following the bolus administration of intravenous contrast. CONTRAST:  134mL OMNIPAQUE IOHEXOL 300 MG/ML   SOLN COMPARISON:  None. FINDINGS: Lower chest: Moderate subsegmental atelectasis at both lung bases. No significant pleural or pericardial effusion. Hepatobiliary: Pre contrast images demonstrate decreased hepatic density suspicious for mild steatosis. Following contrast, no abnormal enhancement or focal hepatic lesion identified. No significant biliary dilatation post cholecystectomy. Pancreas: Unremarkable. No pancreatic ductal dilatation or surrounding inflammatory changes. Spleen: Normal in size without focal abnormality. Adrenals/Urinary Tract: Both adrenal glands appear normal. Pre-contrast images demonstrate no renal, ureteral or bladder calculi. Post-contrast, both kidneys enhance normally. There is no evidence of enhancing renal mass. There are small left renal cysts. Delayed images result in segmental visualization of the ureters. No focal upper tract urothelial abnormalities are identified. A Foley catheter is in place. There is mild bladder wall thickening. Delayed images through the pelvis demonstrate leakage of contrast from the bladder on the right with contrast accumulating around the surgical drain in the right lower quadrant. Stomach/Bowel: There is mild diffuse small and large bowel distension, most consistent with a postoperative ileus. No focal bowel wall thickening or surrounding inflammation. Vascular/Lymphatic: There are no enlarged abdominal or pelvic lymph nodes. No significant vascular findings. Minimal aortoiliac atherosclerosis. Reproductive: Foley catheter is in place. Soft tissue thickening around the bladder base may represent residual prostatic tissue and/or hematoma. Other: There is a left sided approach pelvic drain with tip in the right pelvis. There is a small amount of fluid surrounding this drain with progressive contrast accumulation on the delayed images from the bladder leak described above. There is a small amount extraluminal air beneath the anterior abdominal wall  attributed to the recent surgery. No significant ascites. Musculoskeletal: No acute or significant osseous findings. Lower lumbar spondylosis, greatest at L5-S1. IMPRESSION: 1. There is a small right-sided bladder perforation with leaking contrast-enhanced urine accumulating around the surgical drain in the right lower quadrant. No evidence of ureteral injury or ascites. 2. Soft tissue thickening at the bladder base surrounding the Foley catheter may reflect residual prostatic tissue and/or hematoma post prostatectomy. 3. Mild generalized dilatation of the small and large bowel most consistent with a postoperative ileus. 4. Additional incidental findings including bibasilar atelectasis, left renal cysts and mild hepatic steatosis. 5. These results were called by telephone at the time of interpretation on 06/17/2019 at 1:06 pm to Dr. Nicolette Bang , who verbally acknowledged these results. Electronically Signed   By: Richardean Sale M.D.   On: 06/17/2019 13:07    Disposition:     Follow-up Information    Anis Degidio, Candee Furbish, MD On 06/27/2019.   Specialty: Urology Why: at 10:45 Contact information: [address] [place] [address] [phone]            Signed: Nicolette Bang 06/24/2019, 8:08 AM

## 2020-04-03 ENCOUNTER — Other Ambulatory Visit: Payer: Self-pay

## 2020-04-03 DIAGNOSIS — C61 Malignant neoplasm of prostate: Secondary | ICD-10-CM

## 2020-09-08 ENCOUNTER — Other Ambulatory Visit: Payer: Self-pay

## 2020-09-08 DIAGNOSIS — C61 Malignant neoplasm of prostate: Secondary | ICD-10-CM

## 2020-09-16 ENCOUNTER — Other Ambulatory Visit: Payer: 59

## 2020-09-16 ENCOUNTER — Other Ambulatory Visit: Payer: Self-pay

## 2020-09-16 DIAGNOSIS — C61 Malignant neoplasm of prostate: Secondary | ICD-10-CM

## 2020-09-17 ENCOUNTER — Telehealth: Payer: Self-pay

## 2020-09-17 LAB — PSA: Prostate Specific Ag, Serum: 0.6 ng/mL (ref 0.0–4.0)

## 2020-09-17 NOTE — Telephone Encounter (Signed)
Lft msg. For pt. Then sent letter in [place].

## 2020-09-17 NOTE — Telephone Encounter (Signed)
-----   Message from Cleon Gustin, MD sent at 09/17/2020  9:46 AM EST ----- normal ----- Message ----- From: Valentina Lucks, LPN Sent: [id number]   8:45 AM EST To: Cleon Gustin, MD  Pls review.

## 2020-09-23 ENCOUNTER — Other Ambulatory Visit: Payer: Self-pay

## 2020-09-23 ENCOUNTER — Encounter: Payer: Self-pay | Admitting: Urology

## 2020-09-23 ENCOUNTER — Ambulatory Visit (INDEPENDENT_AMBULATORY_CARE_PROVIDER_SITE_OTHER): Payer: 59 | Admitting: Urology

## 2020-09-23 VITALS — BP 138/88 | HR 88 | Temp 98.8°F | Ht 74.0 in | Wt 350.0 lb

## 2020-09-23 DIAGNOSIS — C61 Malignant neoplasm of prostate: Secondary | ICD-10-CM | POA: Diagnosis not present

## 2020-09-23 DIAGNOSIS — N401 Enlarged prostate with lower urinary tract symptoms: Secondary | ICD-10-CM

## 2020-09-23 DIAGNOSIS — R351 Nocturia: Secondary | ICD-10-CM | POA: Diagnosis not present

## 2020-09-23 LAB — URINALYSIS, ROUTINE W REFLEX MICROSCOPIC
Bilirubin, UA: NEGATIVE
Glucose, UA: NEGATIVE
Ketones, UA: NEGATIVE
Leukocytes,UA: NEGATIVE
Nitrite, UA: NEGATIVE
Protein,UA: NEGATIVE
Specific Gravity, UA: 1.015 (ref 1.005–1.030)
Urobilinogen, Ur: 0.2 mg/dL (ref 0.2–1.0)
pH, UA: 5 (ref 5.0–7.5)

## 2020-09-23 MED ORDER — LEVOFLOXACIN 750 MG PO TABS: 750.0000 mg | ORAL_TABLET | Freq: Once | ORAL | 0 refills | Status: AC

## 2020-09-23 NOTE — Patient Instructions (Addendum)
Appointment Time: 12 pm arrival Appointment Date: 10/21/2020  Location: [facility] Radiology Department   Prostate Biopsy Instructions  Stop all aspirin or blood thinners (aspirin, plavix, coumadin, warfarin, motrin, ibuprofen, advil, aleve, naproxen, naprosyn) for 7 days prior to the procedure.  If you have any questions about stopping these medications, please contact your primary care physician or cardiologist.  Having a light meal prior to the procedure is recommended.  If you are diabetic or have low blood sugar please bring a small snack or glucose tablet.  A Fleets enema is needed to be purchased over the counter at a local pharmacy and used 2 hours before you scheduled appointment.  This can be purchased over the counter at any pharmacy.  Antibiotics will be administered in the clinic at the time of the procedure and 1 tablet has been sent to your pharmacy. Please take the antibiotic as prescribed.    Please bring someone with you to the procedure to drive you home if you are given a valium to take prior to your procedure.   If you have any questions or concerns, please feel free to call the office at [phone] or send a Mychart message.    Thank you, [facility]    Transrectal Ultrasound-Guided Prostate Biopsy This is a procedure to take samples of tissue from your prostate. Ultrasound images are used to guide the procedure. It is usually done to check for prostate cancer. What happens before the procedure? Staying hydrated Follow instructions about liquids. These may include:  Up to 2 hours before the procedure - you may drink clear liquids. This includes water, clear fruit juice, black coffee, and plain tea.  Eating and drinking restrictions Follow instructions about eating and drinking. These may include:  8 hours before the procedure - stop eating heavy meals or foods. This includes meat, fried foods, or fatty foods.  6 hours before the procedure  - stop eating light meals or foods. This includes toast or cereal.  6 hours before the procedure - stop drinking milk or drinks that have milk.  2 hours before the procedure - stop drinking clear liquids. Medicines Ask your doctor about:  Changing or stopping your normal medicines. This is important if you take diabetes medicines or blood thinners.  Taking over-the-counter medicines, vitamins, herbs, and supplements.  Taking medicines such as aspirin and ibuprofen. These medicines can thin your blood. Do not take these medicines unless your doctor tells you to take them. General instructions  You may be given antibiotic medicine. If so, take it as told by your doctor.  Liquid will be used to clear waste from your butt (enema).  You may have a blood sample taken.  You may have a pee (urine) sample taken.  Plan to have someone take you home after your procedure. What happens during the procedure?   To lower your risk of infection: ? Your health care team will wash or sanitize their hands. ? Hair may be removed from the area. ? Your skin will be cleaned with soap. ? You will be given antibiotics.  An IV will be placed into one of your veins.  You will be given one or both of these: ? A medicine to help you relax. ? A medicine to numb the area.  You will lie on your left side. Your knees will be bent.  A probe with gel on it will be placed in your butt. Pictures will be taken of your prostate and  the area around it.  Medicine will be used to numb your prostate.  A needle will be placed in your butt and moved to your prostate.  Prostate tissue will be removed.  The samples will be sent to a lab. The procedure may vary. What happens after the procedure?  You will be watched until the medicines you were given have worn off.  You may have some pain in your butt. You will be given medicine for it. Summary  This procedure is usually done to check for prostate  cancer.  Before the procedure, ask your doctor about changing or stopping your medicines.  You may have some pain in your butt. You will be given medicine for it.  Plan to have someone take you home after the procedure. This information is not intended to replace advice given to you by your health care provider. Make sure you discuss any questions you have with your health care provider. Document Revised: 02/13/2019 Document Reviewed: 01/20/2017 Elsevier Patient Education  [place].

## 2020-09-23 NOTE — Progress Notes (Signed)
Urological Symptom Review  Patient is experiencing the following symptoms: Erection problems (male only)   Review of Systems  Gastrointestinal (upper)  : Negative for upper GI symptoms  Gastrointestinal (lower) : Negative for lower GI symptoms  Constitutional : Night Sweats  Skin: Negative for skin symptoms  Eyes: Negative for eye symptoms  Ear/Nose/Throat : Sinus problems  Hematologic/Lymphatic: Negative for Hematologic/Lymphatic symptoms  Cardiovascular : Negative for cardiovascular symptoms  Respiratory : Negative for respiratory symptoms  Endocrine: Negative for endocrine symptoms  Musculoskeletal: Negative for musculoskeletal symptoms  Neurological: Negative for neurological symptoms  Psychologic: Depression

## 2020-09-23 NOTE — Progress Notes (Signed)
09/23/2020 2:32 PM   Juan [name] 07/31/1961 [id number]  Referring provider: No referring provider defined for this encounter.  Followup prostate cancer  HPI: Juan [name] is a 59yo here for followup for Prostate cancer and BPH.  PSA increased to 0.6 from 0.41. NO issues with urination. He had simple prostatectomy over 1 year ago. Stream strong.No urgency or frequency.   His records from [facility] are as follows: I have an enlarged prostate (follow-up).  HPI: Juan [name] is a 59 year-old male established patient who is here for an enlarged prostate follow-up evaluation.  He is on new medications for symptoms of prostate enlargement. He has been started on flomax since his last visit.   He does not have an abnormal sensation when needing to urinate. He is having problems getting his urine stream started. He does not have to strain or bear down to start his urinary stream. He does not have a good size and strength to his urinary stream. He is not having problems with emptying his bladder well. He does not dribble at the end of urination.   10/16/2018: He is currently on flomax 0.8mg  daily   01/25/2019: 2 weeks ago he developed worsening urgency, frequency, nocturia. NO dysuria or hematuria. He started taking bactrim 2-3 days ago.   03/08/2019: Since last visit he has noted improvement in his urgency, frequency and nocturia. He continues to have hesitancy especially at night.    04/10/2019: His bladder residual had increased more than 100 mL at last office visit. Tamsulosin changed to alfuzosin. He follows up today for repeat exam.   He reports no improvement with FOS or hesitancy. He continues day and nighttime frequency with intermittent severe urgency averaging 60-120 minutes. He reports not getting any sleep and his frequency is affecting his performance at work. No associated painful or burning voiding, no gross hematuria. IPSS today is 22.   04/11/2019: The patient has failed  multiple alpha blockers and 5ARI therapy.   06/27/2019: Pathology report revealed gleason 3+4=7 in less than 5% of tissue submitted   07/18/2019: stream is strong. mild dysuria. He had an episode of gross hematuria over the weekend which since resolved.   10/07/2019: LUTS have significantly improved.     CC: I have prostate cancer.  HPI: His prostate cancer was diagnosed 06/13/2019. He does have the pathology report from his biopsy. His cancer was diagnosed by Nicolette Bang. His most recent PSA is 0.7.   He has not undergone surgery for treatment. He has not undergone External Beam Radiation Therapy for treatment. He has not undergone Hormonal Therapy for treatment.   He does not have urinary incontinence. He does not have problems with erectile dysfunction. He has not recently had unwanted weight loss. He is not having pain in new locations.   10/07/2019: PSa 0.7. Path report from simple prostate was Gleason 3+4=7 in less than 5% of tissue.      AUA Symptom Score: Less than 20% of the time he has the sensation of not emptying his bladder completely when finished urinating. Less than 20% of the time he has to urinate again fewer than two hours after he has finished urinating. He does not have to stop and start again several times when he urinates. He never finds it difficult to postpone urination. Less than 20% of the time he has a weak urinary stream. Less than 20% of the time he has to push or strain to begin urination. He never has to get up  to urinate from the time he goes to bed until the time he gets up in the morning.   Calculated AUA Symptom Score: 4      PMH: Past Medical History:  Diagnosis Date  . GERD (gastroesophageal reflux disease)    mild  . Hypercholesterolemia   . Hypertension   . PONV (postoperative nausea and vomiting)   . Sleep apnea     Surgical History: Past Surgical History:  Procedure Laterality Date  . CHOLECYSTECTOMY    . COLONOSCOPY  2017  . KNEE  ARTHROSCOPY WITH MENISCAL REPAIR Right   . VASECTOMY    . WISDOM TOOTH EXTRACTION Bilateral   . XI ROBOTIC ASSISTED SIMPLE PROSTATECTOMY N/A 06/13/2019   Procedure: XI ROBOTIC ASSISTED SIMPLE PROSTATECTOMY;  Surgeon: Cleon Gustin, MD;  Location: [facility];  Service: Urology;  Laterality: N/A;  3 HRS    Home Medications:  Allergies as of 09/23/2020      Reactions   Latex Rash   Codeine Swelling      Medication List       Accurate as of September 23, 2020  2:32 PM. If you have any questions, ask your nurse or doctor.        STOP taking these medications   sulfamethoxazole-trimethoprim 800-160 MG tablet Commonly known as: BACTRIM DS Stopped by: Nicolette Bang, MD     TAKE these medications   alfuzosin 10 MG 24 hr tablet Commonly known as: UROXATRAL alfuzosin ER 10 mg tablet,extended release 24 hr  TK 1 T PO HS   clonazePAM 1 MG tablet Commonly known as: KLONOPIN clonazepam 1 mg tablet  TK 1 T PO EVERY MORNING AND 2 T HS   clonazePAM 2 MG tablet Commonly known as: KLONOPIN Take by mouth.   lisinopril 20 MG tablet Commonly known as: ZESTRIL Take 20 mg by mouth daily.   mirtazapine 30 MG tablet Commonly known as: REMERON Take 30 mg by mouth at bedtime.   omeprazole 20 MG capsule Commonly known as: PRILOSEC Take 20 mg by mouth daily as needed (acid reflux).   oxymetazoline 0.05 % nasal spray Commonly known as: AFRIN Place 1 spray into both nostrils 2 (two) times daily as needed for congestion.   pravastatin 40 MG tablet Commonly known as: PRAVACHOL Take 40 mg by mouth at bedtime.   QUEtiapine 200 MG tablet Commonly known as: SEROQUEL quetiapine 200 mg tablet   QUEtiapine 100 MG tablet Commonly known as: SEROQUEL quetiapine 100 mg tablet  TK 1 T PO QHS   sertraline 100 MG tablet Commonly known as: ZOLOFT Take 100 mg by mouth every morning.   silodosin 8 MG Caps capsule Commonly known as: RAPAFLO silodosin 8 mg capsule  TK 1 C PO QD     tamsulosin 0.4 MG Caps capsule Commonly known as: FLOMAX tamsulosin 0.4 mg capsule  TK 1 C PO BID   tobramycin 0.3 % ophthalmic solution Commonly known as: TOBREX tobramycin 0.3 % eye drops   traMADol 50 MG tablet Commonly known as: Ultram Take 1-2 tablets (50-100 mg total) by mouth every 6 (six) hours as needed for moderate pain or severe pain.   ZzzQuil 25 MG Caps Generic drug: diphenhydrAMINE HCl (Sleep) Take 25 mg by mouth at bedtime as needed (sleep).       Allergies:  Allergies  Allergen Reactions  . Latex Rash  . Codeine Swelling    Family History: Family History  Problem Relation Age of Onset  . Hypertension Mother   . Hypertension  Father     Social History:  reports that he quit smoking about 22 years ago. His smoking use included cigarettes. He has a 5.00 pack-year smoking history. He has never used smokeless tobacco. He reports current alcohol use. He reports that he does not use drugs.  ROS: All other review of systems were reviewed and are negative except what is noted above in HPI  Physical Exam: BP 138/88   Pulse 88   Temp 98.8 F (37.1 C)   Ht 6\' 2"  (1.88 m)   Wt (!) 350 lb (158.8 kg)   BMI 44.94 kg/m   Constitutional:  Alert and oriented, No acute distress. HEENT: [place] AT, moist mucus membranes.  Trachea midline, no masses. Cardiovascular: No clubbing, cyanosis, or edema. Respiratory: Normal respiratory effort, no increased work of breathing. GI: Abdomen is soft, nontender, nondistended, no abdominal masses GU: No CVA tenderness.  Lymph: No cervical or inguinal lymphadenopathy. Skin: No rashes, bruises or suspicious lesions. Neurologic: Grossly intact, no focal deficits, moving all 4 extremities. Psychiatric: Normal mood and affect.  Laboratory Data: Lab Results  Component Value Date   WBC 14.2 (H) 06/15/2019   HGB 13.2 06/15/2019   HCT 43.6 06/15/2019   MCV 92.2 06/15/2019   PLT 190 06/15/2019    Lab Results  Component Value Date    CREATININE 1.26 (H) 06/15/2019    No results found for: PSA  No results found for: TESTOSTERONE  No results found for: HGBA1C  Urinalysis No results found for: COLORURINE, APPEARANCEUR, LABSPEC, PHURINE, GLUCOSEU, HGBUR, BILIRUBINUR, KETONESUR, PROTEINUR, UROBILINOGEN, NITRITE, LEUKOCYTESUR  No results found for: LABMICR, [place], RBCUA, LABEPIT, MUCUS, BACTERIA  Pertinent Imaging:  No results found for this or any previous visit.  No results found for this or any previous visit.  No results found for this or any previous visit.  No results found for this or any previous visit.  No results found for this or any previous visit.  No results found for this or any previous visit.  No results found for this or any previous visit.  No results found for this or any previous visit.   Assessment & Plan:    1. Benign prostatic hyperplasia with lower urinary tract symptoms, symptom details unspecified -continue observation  2. Nocturia -fluid management  3. Malignant neoplasm of prostate ([place]) -The patient and I talked about etiologies of elevated PSA.  We discussed the possible relationship between elevated PSA, prostate cancer, BPH, prostatitis, and UTI.   Conservative treatment of elevated PSA with watchful waiting was discussed with the patient.  All questions were answered.        All of the risks and benefits along with alternatives to prostate biopsy were discussed with the patient.  The patient gave fully informed consent to proceed with a transrectal ultrasound guided biopsy of the prostate for the evaluation of their evated PSA.  Prostate biopsy instructions and antibiotics were given to the patient.  - Urinalysis, Routine w reflex microscopic   No follow-ups on file.  Nicolette Bang, MD  [facility] [place]

## 2020-10-21 ENCOUNTER — Encounter (HOSPITAL_COMMUNITY): Payer: Self-pay

## 2020-10-21 ENCOUNTER — Ambulatory Visit (HOSPITAL_COMMUNITY)
Admission: RE | Admit: 2020-10-21 | Discharge: 2020-10-21 | Disposition: A | Discharge status: Home / Self Care | Payer: 59 | Source: Ambulatory Visit | Attending: Urology | Admitting: Urology

## 2020-10-21 ENCOUNTER — Other Ambulatory Visit: Payer: Self-pay

## 2020-10-21 ENCOUNTER — Encounter: Payer: Self-pay | Admitting: Urology

## 2020-10-21 ENCOUNTER — Ambulatory Visit (INDEPENDENT_AMBULATORY_CARE_PROVIDER_SITE_OTHER): Payer: 59 | Admitting: Urology

## 2020-10-21 ENCOUNTER — Other Ambulatory Visit: Payer: Self-pay | Admitting: Urology

## 2020-10-21 DIAGNOSIS — C61 Malignant neoplasm of prostate: Secondary | ICD-10-CM | POA: Diagnosis not present

## 2020-10-21 MED ORDER — LIDOCAINE HCL (PF) 2 % IJ SOLN: 10.0000 mL | Freq: Once | INTRAMUSCULAR | Status: AC

## 2020-10-21 MED ORDER — LIDOCAINE HCL (PF) 2 % IJ SOLN
INTRAMUSCULAR | Status: AC
  Administered 2020-10-21: 10 mL
  Filled 2020-10-21: qty 10

## 2020-10-21 MED ORDER — GENTAMICIN SULFATE 40 MG/ML IJ SOLN
INTRAMUSCULAR | Status: AC
  Administered 2020-10-21: 80 mg via INTRAMUSCULAR
  Filled 2020-10-21: qty 2

## 2020-10-21 MED ORDER — GENTAMICIN SULFATE 40 MG/ML IJ SOLN: 80.0000 mg | Freq: Once | INTRAMUSCULAR | Status: AC

## 2020-10-21 NOTE — Discharge Instructions (Signed)

## 2020-10-21 NOTE — Progress Notes (Signed)
Prostate Biopsy Procedure   Informed consent was obtained after discussing risks/benefits of the procedure.  A time out was performed to ensure correct patient identity.  Pre-Procedure: - Last PSA Level: No results found for: PSA - Gentamicin given prophylactically - Levaquin 500 mg administered PO -Transrectal Ultrasound performed revealing a 54.3 gm prostate -No significant hypoechoic or median lobe noted  Procedure: - Prostate block performed using 10 cc 1% lidocaine and biopsies taken from sextant areas, a total of 12 under ultrasound guidance.  Post-Procedure: - Patient tolerated the procedure well - He was counseled to seek immediate medical attention if experiences any severe pain, significant bleeding, or fevers - Return in one week to discuss biopsy results

## 2020-10-21 NOTE — Sedation Documentation (Signed)
PT tolerated prostate biopsy procedure well today. Labs obtained and sent for pathology at 1255. PT ambulatory at discharge with no acute distress noted and verbalized understanding of discharge instructions. PT to follow up with urologist as scheduled on 11/04/20 at 3:30.

## 2020-10-21 NOTE — Patient Instructions (Signed)

## 2020-11-04 ENCOUNTER — Ambulatory Visit: Payer: 59 | Admitting: Urology

## 2021-04-12 ENCOUNTER — Other Ambulatory Visit: Payer: Self-pay

## 2021-04-12 ENCOUNTER — Other Ambulatory Visit: Payer: 59

## 2021-04-12 DIAGNOSIS — C61 Malignant neoplasm of prostate: Secondary | ICD-10-CM

## 2021-04-13 LAB — PSA: Prostate Specific Ag, Serum: 0.6 ng/mL (ref 0.0–4.0)

## 2021-04-21 ENCOUNTER — Ambulatory Visit (INDEPENDENT_AMBULATORY_CARE_PROVIDER_SITE_OTHER): Payer: 59 | Admitting: Urology

## 2021-04-21 ENCOUNTER — Other Ambulatory Visit: Payer: Self-pay

## 2021-04-21 VITALS — BP 151/88 | HR 72 | Ht 74.0 in | Wt 345.0 lb

## 2021-04-21 DIAGNOSIS — N401 Enlarged prostate with lower urinary tract symptoms: Secondary | ICD-10-CM | POA: Diagnosis not present

## 2021-04-21 DIAGNOSIS — R351 Nocturia: Secondary | ICD-10-CM

## 2021-04-21 DIAGNOSIS — C61 Malignant neoplasm of prostate: Secondary | ICD-10-CM

## 2021-04-21 NOTE — Progress Notes (Signed)
04/21/2021 10:47 AM   Almira Coaster 05/03/1961 [id number]  Referring provider: No referring provider defined for this encounter.   Followup BPh and prostate cancer  HPI: Juan [name] is a 60yo here for followup for BPH and Prostate cancer. PSA stable at 0.6. Nocturia 1x based on fluid consumption. He is very pleased with his urination after simple prostatectomy. No other complaitns today   PMH: Past Medical History:  Diagnosis Date   GERD (gastroesophageal reflux disease)    mild   Hypercholesterolemia    Hypertension    PONV (postoperative nausea and vomiting)    Sleep apnea     Surgical History: Past Surgical History:  Procedure Laterality Date   CHOLECYSTECTOMY     COLONOSCOPY  2017   KNEE ARTHROSCOPY WITH MENISCAL REPAIR Right    VASECTOMY     WISDOM TOOTH EXTRACTION Bilateral    XI ROBOTIC ASSISTED SIMPLE PROSTATECTOMY N/A 06/13/2019   Procedure: XI ROBOTIC ASSISTED SIMPLE PROSTATECTOMY;  Surgeon: Cleon Gustin, MD;  Location: [facility];  Service: Urology;  Laterality: N/A;  3 HRS    Home Medications:  Allergies as of 04/21/2021       Reactions   Latex Rash   Codeine Swelling        Medication List        Accurate as of April 21, 2021 10:47 AM. If you have any questions, ask your nurse or doctor.          alfuzosin 10 MG 24 hr tablet Commonly known as: UROXATRAL alfuzosin ER 10 mg tablet,extended release 24 hr  TK 1 T PO HS   clonazePAM 1 MG tablet Commonly known as: KLONOPIN clonazepam 1 mg tablet  TK 1 T PO EVERY MORNING AND 2 T HS   clonazePAM 2 MG tablet Commonly known as: KLONOPIN Take by mouth.   lisinopril 20 MG tablet Commonly known as: ZESTRIL Take 20 mg by mouth daily.   mirtazapine 30 MG tablet Commonly known as: REMERON Take 30 mg by mouth at bedtime.   omeprazole 20 MG capsule Commonly known as: PRILOSEC Take 20 mg by mouth daily as needed (acid reflux).   oxymetazoline 0.05 % nasal spray Commonly known as:  AFRIN Place 1 spray into both nostrils 2 (two) times daily as needed for congestion.   pravastatin 40 MG tablet Commonly known as: PRAVACHOL Take 40 mg by mouth at bedtime.   QUEtiapine 200 MG tablet Commonly known as: SEROQUEL quetiapine 200 mg tablet   QUEtiapine 100 MG tablet Commonly known as: SEROQUEL quetiapine 100 mg tablet  TK 1 T PO QHS   sertraline 100 MG tablet Commonly known as: ZOLOFT Take 100 mg by mouth every morning.   silodosin 8 MG Caps capsule Commonly known as: RAPAFLO silodosin 8 mg capsule  TK 1 C PO QD   tamsulosin 0.4 MG Caps capsule Commonly known as: FLOMAX tamsulosin 0.4 mg capsule  TK 1 C PO BID   tobramycin 0.3 % ophthalmic solution Commonly known as: TOBREX tobramycin 0.3 % eye drops   traMADol 50 MG tablet Commonly known as: Ultram Take 1-2 tablets (50-100 mg total) by mouth every 6 (six) hours as needed for moderate pain or severe pain.   ZzzQuil 25 MG Caps Generic drug: diphenhydrAMINE HCl (Sleep) Take 25 mg by mouth at bedtime as needed (sleep).        Allergies:  Allergies  Allergen Reactions   Latex Rash   Codeine Swelling    Family History: Family History  Problem Relation Age of Onset   Hypertension Mother    Hypertension Father     Social History:  reports that he quit smoking about 23 years ago. His smoking use included cigarettes. He has a 5.00 pack-year smoking history. He has never used smokeless tobacco. He reports current alcohol use. He reports that he does not use drugs.  ROS: All other review of systems were reviewed and are negative except what is noted above in HPI  Physical Exam: BP (!) 151/88   Pulse 72   Ht 6\' 2"  (1.88 m)   Wt (!) 345 lb (156.5 kg)   BMI 44.30 kg/m   Constitutional:  Alert and oriented, No acute distress. HEENT: [place] AT, moist mucus membranes.  Trachea midline, no masses. Cardiovascular: No clubbing, cyanosis, or edema. Respiratory: Normal respiratory effort, no increased  work of breathing. GI: Abdomen is soft, nontender, nondistended, no abdominal masses GU: No CVA tenderness.  Lymph: No cervical or inguinal lymphadenopathy. Skin: No rashes, bruises or suspicious lesions. Neurologic: Grossly intact, no focal deficits, moving all 4 extremities. Psychiatric: Normal mood and affect.  Laboratory Data: Lab Results  Component Value Date   WBC 14.2 (H) 06/15/2019   HGB 13.2 06/15/2019   HCT 43.6 06/15/2019   MCV 92.2 06/15/2019   PLT 190 06/15/2019    Lab Results  Component Value Date   CREATININE 1.26 (H) 06/15/2019    No results found for: PSA  No results found for: TESTOSTERONE  No results found for: HGBA1C  Urinalysis    Component Value Date/Time   APPEARANCEUR Clear 09/23/2020 1420   GLUCOSEU Negative 09/23/2020 1420   BILIRUBINUR Negative 09/23/2020 1420   PROTEINUR Negative 09/23/2020 1420   NITRITE Negative 09/23/2020 1420   LEUKOCYTESUR Negative 09/23/2020 1420    Lab Results  Component Value Date   LABMICR Comment 09/23/2020    Pertinent Imaging:  No results found for this or any previous visit.  No results found for this or any previous visit.  No results found for this or any previous visit.  No results found for this or any previous visit.  No results found for this or any previous visit.  No results found for this or any previous visit.  No results found for this or any previous visit.  No results found for this or any previous visit.   Assessment & Plan:    1. Malignant neoplasm of prostate ([place]) -RTC 1 year with PSA - Urinalysis, Routine w reflex microscopic  2. Benign prostatic hyperplasia with lower urinary tract symptoms, symptom details unspecified -LUTS minimal after simple prostatectomy   3. Nocturia Fluid management prior to going to bed   No follow-ups on file.  Nicolette Bang, MD  [facility] [place]

## 2021-04-21 NOTE — Progress Notes (Signed)
Urological Symptom Review  Patient is experiencing the following symptoms:  6 mo PSA   Review of Systems  Gastrointestinal (upper)  : Negative for upper GI symptoms  Gastrointestinal (lower) : Negative for lower GI symptoms  Constitutional : Negative for symptoms  Skin: Negative for skin symptoms  Eyes: Negative for eye symptoms  Ear/Nose/Throat : Negative for Ear/Nose/Throat symptoms  Hematologic/Lymphatic: Negative for Hematologic/Lymphatic symptoms  Cardiovascular : Negative for cardiovascular symptoms  Respiratory : Negative for respiratory symptoms  Endocrine: Negative for endocrine symptoms  Musculoskeletal: Negative for musculoskeletal symptoms  Neurological: Negative for neurological symptoms  Psychologic: Negative for psychiatric symptoms

## 2021-04-21 NOTE — Patient Instructions (Signed)
Benign Prostatic Hyperplasia  Benign prostatic hyperplasia (BPH) is an enlarged prostate gland that is caused by the normal aging process and not by cancer. The prostate is a walnut-sized gland that is involved in the production of semen. It is located in front of the rectum and below the bladder. The bladder stores urine and the urethra is the tube that carries the urine out of the body. The prostate may get bigger asa man gets older. An enlarged prostate can press on the urethra. This can make it harder to pass urine. The build-up of urine in the bladder can cause infection. Back pressure and infection may progress to bladder damage and kidney (renal) failure. What are the causes? This condition is part of a normal aging process. However, not all men develop problems from this condition. If the prostate enlarges away from the urethra, urine flow will not be blocked. If it enlarges toward the urethra andcompresses it, there will be problems passing urine. What increases the risk? This condition is more likely to develop in men over the age of 50 years. What are the signs or symptoms? Symptoms of this condition include: Getting up often during the night to urinate. Needing to urinate frequently during the day. Difficulty starting urine flow. Decrease in size and strength of your urine stream. Leaking (dribbling) after urinating. Inability to pass urine. This needs immediate treatment. Inability to completely empty your bladder. Pain when you pass urine. This is more common if there is also an infection. Urinary tract infection (UTI). How is this diagnosed? This condition is diagnosed based on your medical history, a physical exam, and your symptoms. Tests will also be done, such as: A post-void bladder scan. This measures any amount of urine that may remain in your bladder after you finish urinating. A digital rectal exam. In a rectal exam, your health care provider checks your prostate by  putting a lubricated, gloved finger into your rectum to feel the back of your prostate gland. This exam detects the size of your gland and any abnormal lumps or growths. An exam of your urine (urinalysis). A prostate specific antigen (PSA) screening. This is a blood test used to screen for prostate cancer. An ultrasound. This test uses sound waves to electronically produce a picture of your prostate gland. Your health care provider may refer you to a specialist in kidney and prostate diseases (urologist). How is this treated? Once symptoms begin, your health care provider will monitor your condition (active surveillance or watchful waiting). Treatment for this condition will depend on the severity of your condition. Treatment may include: Observation and yearly exams. This may be the only treatment needed if your condition and symptoms are mild. Medicines to relieve your symptoms, including: Medicines to shrink the prostate. Medicines to relax the muscle of the prostate. Surgery in severe cases. Surgery may include: Prostatectomy. In this procedure, the prostate tissue is removed completely through an open incision or with a laparoscope or robotics. Transurethral resection of the prostate (TURP). In this procedure, a tool is inserted through the opening at the tip of the penis (urethra). It is used to cut away tissue of the inner core of the prostate. The pieces are removed through the same opening of the penis. This removes the blockage. Transurethral incision (TUIP). In this procedure, small cuts are made in the prostate. This lessens the prostate's pressure on the urethra. Transurethral microwave thermotherapy (TUMT). This procedure uses microwaves to create heat. The heat destroys and removes a small   amount of prostate tissue. Transurethral needle ablation (TUNA). This procedure uses radio frequencies to destroy and remove a small amount of prostate tissue. Interstitial laser coagulation (ILC).  This procedure uses a laser to destroy and remove a small amount of prostate tissue. Transurethral electrovaporization (TUVP). This procedure uses electrodes to destroy and remove a small amount of prostate tissue. Prostatic urethral lift. This procedure inserts an implant to push the lobes of the prostate away from the urethra. Follow these instructions at home: Take over-the-counter and prescription medicines only as told by your health care provider. Monitor your symptoms for any changes. Contact your health care provider with any changes. Avoid drinking large amounts of liquid before going to bed or out in public. Avoid or reduce how much caffeine or alcohol you drink. Give yourself time when you urinate. Keep all follow-up visits as told by your health care provider. This is important. Contact a health care provider if: You have unexplained back pain. Your symptoms do not get better with treatment. You develop side effects from the medicine you are taking. Your urine becomes very dark or has a bad smell. Your lower abdomen becomes distended and you have trouble passing your urine. Get help right away if: You have a fever or chills. You suddenly cannot urinate. You feel lightheaded, or very dizzy, or you faint. There are large amounts of blood or clots in the urine. Your urinary problems become hard to manage. You develop moderate to severe low back or flank pain. The flank is the side of your body between the ribs and the hip. These symptoms may represent a serious problem that is an emergency. Do not wait to see if the symptoms will go away. Get medical help right away. Call your local emergency services (911 in the [place] Do not drive yourself to the hospital. Summary Benign prostatic hyperplasia (BPH) is an enlarged prostate that is caused by the normal aging process and not by cancer. An enlarged prostate can press on the urethra. This can make it hard to pass urine. This  condition is part of a normal aging process and is more likely to develop in men over the age of 50 years. Get help right away if you suddenly cannot urinate. This information is not intended to replace advice given to you by your health care provider. Make sure you discuss any questions you have with your healthcare provider. Document Revised: 07/02/2020 Document Reviewed: 07/02/2020 Elsevier Patient Education  2022 [employer]  

## 2021-04-22 LAB — MICROSCOPIC EXAMINATION: Bacteria, UA: NONE SEEN

## 2021-04-22 LAB — URINALYSIS, ROUTINE W REFLEX MICROSCOPIC
Bilirubin, UA: NEGATIVE
Glucose, UA: NEGATIVE
Ketones, UA: NEGATIVE
Nitrite, UA: NEGATIVE
Protein,UA: NEGATIVE
Specific Gravity, UA: 1.025 (ref 1.005–1.030)
Urobilinogen, Ur: 0.2 mg/dL (ref 0.2–1.0)
pH, UA: 6 (ref 5.0–7.5)

## 2021-05-04 ENCOUNTER — Encounter: Payer: Self-pay | Admitting: Urology

## 2021-05-16 ENCOUNTER — Encounter (HOSPITAL_COMMUNITY): Payer: Self-pay | Admitting: Emergency Medicine

## 2021-05-16 ENCOUNTER — Other Ambulatory Visit: Payer: Self-pay

## 2021-05-16 ENCOUNTER — Emergency Department (HOSPITAL_COMMUNITY)
Admission: EM | Admit: 2021-05-16 | Discharge: 2021-05-16 | Disposition: A | Discharge status: Home / Self Care | Payer: 59 | Attending: Emergency Medicine | Admitting: Emergency Medicine

## 2021-05-16 ENCOUNTER — Emergency Department (HOSPITAL_COMMUNITY): Payer: 59

## 2021-05-16 DIAGNOSIS — Z79899 Other long term (current) drug therapy: Secondary | ICD-10-CM | POA: Insufficient documentation

## 2021-05-16 DIAGNOSIS — Z91040 Latex allergy status: Secondary | ICD-10-CM | POA: Diagnosis not present

## 2021-05-16 DIAGNOSIS — I10 Essential (primary) hypertension: Secondary | ICD-10-CM | POA: Insufficient documentation

## 2021-05-16 DIAGNOSIS — Y92009 Unspecified place in unspecified non-institutional (private) residence as the place of occurrence of the external cause: Secondary | ICD-10-CM | POA: Insufficient documentation

## 2021-05-16 DIAGNOSIS — M25562 Pain in left knee: Secondary | ICD-10-CM

## 2021-05-16 DIAGNOSIS — Z87891 Personal history of nicotine dependence: Secondary | ICD-10-CM | POA: Diagnosis not present

## 2021-05-16 DIAGNOSIS — W228XXA Striking against or struck by other objects, initial encounter: Secondary | ICD-10-CM | POA: Insufficient documentation

## 2021-05-16 DIAGNOSIS — S8992XA Unspecified injury of left lower leg, initial encounter: Secondary | ICD-10-CM | POA: Insufficient documentation

## 2021-05-16 LAB — CBC WITH DIFFERENTIAL/PLATELET
Abs Immature Granulocytes: 0.04 10*3/uL (ref 0.00–0.07)
Basophils Absolute: 0.1 10*3/uL (ref 0.0–0.1)
Basophils Relative: 1 %
Eosinophils Absolute: 0.1 10*3/uL (ref 0.0–0.5)
Eosinophils Relative: 1 %
HCT: 51.4 % (ref 39.0–52.0)
Hemoglobin: 16.2 g/dL (ref 13.0–17.0)
Immature Granulocytes: 1 %
Lymphocytes Relative: 13 %
Lymphs Abs: 1.2 10*3/uL (ref 0.7–4.0)
MCH: 28.5 pg (ref 26.0–34.0)
MCHC: 31.5 g/dL (ref 30.0–36.0)
MCV: 90.5 fL (ref 80.0–100.0)
Monocytes Absolute: 0.5 10*3/uL (ref 0.1–1.0)
Monocytes Relative: 6 %
Neutro Abs: 6.9 10*3/uL (ref 1.7–7.7)
Neutrophils Relative %: 78 %
Platelets: 220 10*3/uL (ref 150–400)
RBC: 5.68 MIL/uL (ref 4.22–5.81)
RDW: 15.8 % — ABNORMAL HIGH (ref 11.5–15.5)
WBC: 8.8 10*3/uL (ref 4.0–10.5)
nRBC: 0 % (ref 0.0–0.2)

## 2021-05-16 LAB — COMPREHENSIVE METABOLIC PANEL
ALT: 37 U/L (ref 0–44)
AST: 52 U/L — ABNORMAL HIGH (ref 15–41)
Albumin: 3.6 g/dL (ref 3.5–5.0)
Alkaline Phosphatase: 79 U/L (ref 38–126)
Anion gap: 8 (ref 5–15)
BUN: 20 mg/dL (ref 6–20)
CO2: 27 mmol/L (ref 22–32)
Calcium: 8.9 mg/dL (ref 8.9–10.3)
Chloride: 104 mmol/L (ref 98–111)
Creatinine, Ser: 1.26 mg/dL — ABNORMAL HIGH (ref 0.61–1.24)
GFR, Estimated: >60 mL/min (ref >60)
Glucose, Bld: 104 mg/dL — ABNORMAL HIGH (ref 70–99)
Potassium: 4.2 mmol/L (ref 3.5–5.1)
Sodium: 139 mmol/L (ref 135–145)
Total Bilirubin: 1 mg/dL (ref 0.3–1.2)
Total Protein: 7.4 g/dL (ref 6.5–8.1)

## 2021-05-16 MED ORDER — FENTANYL CITRATE (PF) 100 MCG/2ML IJ SOLN
50.0000 ug | Freq: Once | INTRAMUSCULAR | Status: AC
  Administered 2021-05-16: 50 ug via INTRAVENOUS
  Filled 2021-05-16: qty 2

## 2021-05-16 MED ORDER — OXYCODONE-ACETAMINOPHEN 5-325 MG PO TABS: 1.0000 | ORAL_TABLET | Freq: Four times a day (QID) | ORAL | 0 refills | Status: DC | PRN

## 2021-05-16 MED ORDER — HYDROMORPHONE HCL 1 MG/ML IJ SOLN
1.0000 mg | Freq: Once | INTRAMUSCULAR | Status: AC
  Administered 2021-05-16: 1 mg via INTRAVENOUS
  Filled 2021-05-16: qty 1

## 2021-05-16 MED ORDER — OXYCODONE HCL ER 10 MG PO T12A
10.0000 mg | EXTENDED_RELEASE_TABLET | Freq: Two times a day (BID) | ORAL | Status: DC
  Administered 2021-05-16: 10 mg via ORAL
  Filled 2021-05-16: qty 1

## 2021-05-16 MED ORDER — ONDANSETRON HCL 4 MG/2ML IJ SOLN
4.0000 mg | Freq: Once | INTRAMUSCULAR | Status: AC
  Administered 2021-05-16: 4 mg via INTRAVENOUS
  Filled 2021-05-16: qty 2

## 2021-05-16 NOTE — ED Triage Notes (Signed)
Pt arrived via EMS from home. Pt was lifting a dishwasher and it hit him in the left knee. Pt did not fall, and did not hit his head. Pt has distal pulses and can move his toes.

## 2021-05-16 NOTE — Discharge Instructions (Addendum)
As we discussed, your x-rays did not show any evidence of broken bone.  There is some concern that there may be a tendon injury.  Wear the knee immobilizer for support and stabilization.  Use crutches as directed.  Please follow-up with the referred orthopedic doctor tomorrow.  You can take Tylenol or Ibuprofen as directed for pain. You can alternate Tylenol and Ibuprofen every 4 hours. If you take Tylenol at 1pm, then you can take Ibuprofen at 5pm. Then you can take Tylenol again at 9pm.   Take pain medications as directed for break through pain. Do not drive or operate machinery while taking this medication.   You have been given an extended release pain medication for you should be good until tomorrow morning.  Return to the ER for any worsening pain, numbness/weakness, discoloration.

## 2021-05-16 NOTE — Progress Notes (Signed)
Orthopedic Tech Progress Note Patient Details:  Juan [name] 1961-01-12 [id number]  Ortho Devices Type of Ortho Device: Knee Immobilizer Ortho Device/Splint Location: lle Ortho Device/Splint Interventions: Ordered, Application, Adjustment   Post Interventions Patient Tolerated: Well Instructions Provided: Care of device, Adjustment of device  [name] 05/16/2021, 11:21 PM

## 2021-05-16 NOTE — ED Provider Notes (Signed)
[facility] DEPT Provider Note   CSN: [id number] Arrival date & time: 05/16/21  1826     History Chief Complaint  Patient presents with   Knee Injury    Juan [name] is a 60 y.o. male past ministry of GERD, hypertension brought in by EMS for evaluation of left knee injury.  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  He reports that he got to the landing and states that the dishwasher started slipping and caused his body to move.  He thinks the dishwasher may have hit him in the leg and states that he went forward but his leg stayed on the landing.  He states that since then he has had pain to his left knee and into his left thigh.  He did not land directly on the knee.  He states he has not tried to ambulate or bear weight since this happened.  He states when he tries to lift it off the table, that is when it hurts the most.  He did not have any head injury or LOC.  He denies any numbness/weakness.  The history is provided by the patient.      Past Medical History:  Diagnosis Date   GERD (gastroesophageal reflux disease)    mild   Hypercholesterolemia    Hypertension    PONV (postoperative nausea and vomiting)    Sleep apnea     Patient Active Problem List   Diagnosis Date Noted   BPH with obstruction/lower urinary tract symptoms 06/13/2019    Past Surgical History:  Procedure Laterality Date   CHOLECYSTECTOMY     COLONOSCOPY  2017   KNEE ARTHROSCOPY WITH MENISCAL REPAIR Right    VASECTOMY     WISDOM TOOTH EXTRACTION Bilateral    XI ROBOTIC ASSISTED SIMPLE PROSTATECTOMY N/A 06/13/2019   Procedure: XI ROBOTIC ASSISTED SIMPLE PROSTATECTOMY;  Surgeon: Cleon Gustin, MD;  Location: [facility];  Service: Urology;  Laterality: N/A;  3 HRS       Family History  Problem Relation Age of Onset   Hypertension Mother    Hypertension Father     Social History   Tobacco Use   Smoking status: Former    Packs/day:  0.50    Years: 10.00    Pack years: 5.00    Types: Cigarettes    Quit date: 1999    Years since quitting: 23.5   Smokeless tobacco: Never  Vaping Use   Vaping Use: Never used  Substance Use Topics   Alcohol use: Yes    Comment: occasionally   Drug use: No    Home Medications Prior to Admission medications   Medication Sig Start Date End Date Taking? Authorizing Provider  oxyCODONE-acetaminophen (PERCOCET/ROXICET) 5-325 MG tablet Take 1 tablet by mouth every 6 (six) hours as needed for severe pain. 05/16/21  Yes Volanda Napoleon, PA-C  alfuzosin (UROXATRAL) 10 MG 24 hr tablet alfuzosin ER 10 mg tablet,extended release 24 hr  TK 1 T PO HS Patient not taking: Reported on 09/23/2020    [provider]  clonazePAM (KLONOPIN) 1 MG tablet clonazepam 1 mg tablet  TK 1 T PO EVERY MORNING AND 2 T HS    [provider]  clonazePAM (KLONOPIN) 2 MG tablet Take by mouth. 09/08/20   [provider]  diphenhydrAMINE HCl, Sleep, (ZZZQUIL) 25 MG CAPS Take 25 mg by mouth at bedtime as needed (sleep).    [provider]  lisinopril (PRINIVIL,ZESTRIL) 20  MG tablet Take 20 mg by mouth daily.    [provider]  mirtazapine (REMERON) 30 MG tablet Take 30 mg by mouth at bedtime. 09/08/20   [provider]  omeprazole (PRILOSEC) 20 MG capsule Take 20 mg by mouth daily as needed (acid reflux).    [provider]  oxymetazoline (AFRIN) 0.05 % nasal spray Place 1 spray into both nostrils 2 (two) times daily as needed for congestion.    [provider]  pravastatin (PRAVACHOL) 40 MG tablet Take 40 mg by mouth at bedtime.     [provider]  QUEtiapine (SEROQUEL) 100 MG tablet quetiapine 100 mg tablet  TK 1 T PO QHS    [provider]  QUEtiapine (SEROQUEL) 200 MG tablet quetiapine 200 mg tablet    [provider]  sertraline (ZOLOFT) 100 MG tablet Take 100 mg by mouth every morning. 09/08/20   [provider]  tobramycin (TOBREX) 0.3 % ophthalmic solution tobramycin 0.3 % eye drops    [provider]  traMADol (ULTRAM) 50 MG tablet Take 1-2 tablets (50-100 mg total) by mouth every 6 (six) hours as needed for moderate pain or severe pain. Patient not taking: Reported on 04/21/2021 06/13/19   Debbrah Alar, PA-C    Allergies    Latex and Codeine  Review of Systems   Review of Systems  Musculoskeletal:        Left knee pain  Neurological:  Negative for weakness and numbness.  All other systems reviewed and are negative.  Physical Exam Updated Vital Signs BP (!) 152/88 (BP Location: Left Arm)   Pulse 90   Temp 98.5 F (36.9 C) (Oral)   Resp 16   Ht 6\' 2"  (1.88 m)   Wt (!) 158.8 kg   SpO2 99%   BMI 44.94 kg/m   Physical Exam Vitals and nursing note reviewed.  Constitutional:      Appearance: He is well-developed.  HENT:     Head: Normocephalic and atraumatic.  Eyes:     General: No scleral icterus.       Right eye: No discharge.        Left eye: No discharge.     Conjunctiva/sclera: Conjunctivae normal.  Cardiovascular:     Pulses:          Dorsalis pedis pulses are 2+ on the right side and 2+ on the left side.  Pulmonary:     Effort: Pulmonary effort is normal.  Musculoskeletal:     Comments: Tenderness palpation noted to the anterior and medical aspect of his left knee.  There does seem to be some patellar instability noted.  No deformity or crepitus noted.  Limited range of motion secondary to pain.  Extension intact but he has difficulty flexing it.  He has difficulty lifting up the table and there appears to be a divot in between the patella and quadricep region.  He also has difficulty just lifting the lower part of his leg up while keeping the thigh on the table.  He has some mild tenderness of the distal femur as well.  No deformity crepitus noted.  No bony tenderness in left hip, left tib-fib, left ankle.  He can wiggle all 5 toes without any difficulty.  No  tenderness palpation of the right lower extremity.  Full range of motion without any difficulty.  Skin:    General: Skin is warm and dry.     Capillary Refill: Capillary refill takes less than 2 seconds.  Comments: Good distal cap refill. LLE is not dusky in appearance or cool to touch.  Neurological:     Mental Status: He is alert.     Comments: Sensation intact along major nerve distributions of LLE.   Psychiatric:        Speech: Speech normal.        Behavior: Behavior normal.   ED Results / Procedures / Treatments   Labs (all labs ordered are listed, but only abnormal results are displayed) Labs Reviewed  CBC WITH DIFFERENTIAL/PLATELET - Abnormal; Notable for the following components:      Result Value   RDW 15.8 (*)    All other components within normal limits  COMPREHENSIVE METABOLIC PANEL - Abnormal; Notable for the following components:   Glucose, Bld 104 (*)    Creatinine, Ser 1.26 (*)    AST 52 (*)    All other components within normal limits    EKG None  Radiology DG Knee Complete 4 Views Left  Result Date: 05/16/2021 CLINICAL DATA:  Knee pain EXAM: LEFT KNEE - COMPLETE 4+ VIEW COMPARISON:  None. FINDINGS: No evidence of fracture, dislocation, or joint effusion. Mild patellofemoral and medial tibiofemoral degenerative change. Well corticated calcific density along the medial femoral condyle, likely sequela prior injury. Soft tissues are unremarkable. IMPRESSION: Mild degenerative changes of the left knee. No acute findings. Electronically Signed   By: Dahlia Bailiff MD   On: 05/16/2021 20:01   DG Femur Min 2 Views Left  Result Date: 05/16/2021 CLINICAL DATA:  Pain after trauma EXAM: LEFT FEMUR 2 VIEWS COMPARISON:  None. FINDINGS: There is no evidence of fracture or other focal bone lesions. Soft tissues are unremarkable. IMPRESSION: No acute osseous abnormality. Electronically Signed   By: Dahlia Bailiff MD   On: 05/16/2021 20:02    Procedures Procedures    Medications Ordered in ED Medications  oxyCODONE (OXYCONTIN) 12 hr tablet 10 mg (10 mg Oral Given 05/16/21 2332)  fentaNYL (SUBLIMAZE) injection 50 mcg (50 mcg Intravenous Given 05/16/21 1853)  ondansetron (ZOFRAN) injection 4 mg (4 mg Intravenous Given 05/16/21 1853)  HYDROmorphone (DILAUDID) injection 1 mg (1 mg Intravenous Given 05/16/21 2025)    ED Course  I have reviewed the triage vital signs and the nursing notes.  Pertinent labs & imaging results that were available during my care of the patient were reviewed by me and considered in my medical decision making (see chart for details).    MDM Rules/Calculators/A&P                          60 year old male who presents via EMS for evaluation of left knee pain..,,  That he was carrying a washer upstairs and states when he got to landing, the washer shifted.  He thinks he might of hit him in the knee and states that he went forward and states his leg stayed up on landing.  He did not fall on it.  No head injury, LOC.  Has been having difficulty ambulating bearing weight since then.  No numbness/weakness.  On initially arrival, he is afebrile, toxic appearing.  Vital signs are stable.  He has good DP pulses bilaterally, good cap refill.  He does have tenderness palpation in the anterior aspect of the left knee.  He has limited range of motion secondary to pain.  Leg is fully held in extension of he has difficulty lifting up against gravity.  He he has difficulty lifting the whole leg  as well as keeping the thigh on the table and lifting the lower part of the leg.  He does have a divot noted at the patella and the quadriceps region.  Question of there is a tendon rupture versus fracture.  We will plan for x-ray imaging.    Concern for possible patellar and quadriceps tendon injury.   Knee x-ray shows no evidence of fracture dislocation or joint effusion.  There is a mild patellofemoral and medial tibiofemoral degenerative change.  Femur XR  negative.  At this time, clinically concerned that he has a patellar or quadriceps tendon injury.  Discussed patient with Dr.  Tamera Punt (Ortho) who recommended knee immobilizer and crutches. No further imaging needed.  He will plan to see patient in the office tomorrow.  Updated patient on plan.  He is agreeable.  Will give short course of pain medication for acute/breakthrough pain.  Patient has a history of codeine allergy listed on his chart but patient states he can tolerate oxycodone.  Patient is planning to pick up his pain medication tomorrow.  We will give an extended release oxycodone here in the ED to carry him through the night and then he can pick up the prescriptions tomorrow.  Patient instructed follow-up with orthopedic as directed tomorrow. At this time, patient exhibits no emergent life-threatening condition that require further evaluation in ED. Patient had ample opportunity for questions and discussion. All patient's questions were answered with full understanding. Strict return precautions discussed. Patient expresses understanding and agreement to plan.   Portions of this note were generated with [employer]. Dictation errors may occur despite best attempts at proofreading.   Final Clinical Impression(s) / ED Diagnoses Final diagnoses:  Acute pain of left knee    Rx / DC Orders ED Discharge Orders          Ordered    oxyCODONE-acetaminophen (PERCOCET/ROXICET) 5-325 MG tablet  Every 6 hours PRN        05/16/21 2303             Volanda Napoleon, PA-C 05/16/21 2347    Tegeler, Gwenyth Allegra, MD 05/17/21 [phone]

## 2021-05-18 ENCOUNTER — Other Ambulatory Visit: Payer: Self-pay | Admitting: Orthopedic Surgery

## 2021-05-18 DIAGNOSIS — M25562 Pain in left knee: Secondary | ICD-10-CM

## 2021-05-19 ENCOUNTER — Other Ambulatory Visit: Payer: 59

## 2021-05-20 ENCOUNTER — Ambulatory Visit
Admission: RE | Admit: 2021-05-20 | Discharge: 2021-05-20 | Disposition: A | Discharge status: Home / Self Care | Payer: 59 | Source: Ambulatory Visit | Attending: Orthopedic Surgery | Admitting: Orthopedic Surgery

## 2021-05-20 ENCOUNTER — Other Ambulatory Visit: Payer: Self-pay

## 2021-05-20 DIAGNOSIS — M25562 Pain in left knee: Secondary | ICD-10-CM

## 2021-05-21 ENCOUNTER — Other Ambulatory Visit: Payer: 59

## 2021-05-26 ENCOUNTER — Other Ambulatory Visit: Payer: 59

## 2021-05-31 ENCOUNTER — Other Ambulatory Visit: Payer: Self-pay | Admitting: Orthopedic Surgery

## 2021-06-02 NOTE — Progress Notes (Addendum)
COVID Vaccine Completed: Yes x1  Date COVID Vaccine completed: 02-04-20 Has received booster: Yes x1 COVID vaccine manufacturer: Pfizer booster &    [employer]   Date of COVID positive in last 90 days:  Home test positive 05-28-21.    PCP - [facility] Cardiologist - No  Chest x-ray - N/A EKG - 06-03-21 Epic Stress Test - 10+ years ago ECHO -  Cardiac Cath -  Pacemaker/ICD device last checked: Spinal Cord Stimulator:  Sleep Study - Yes, +sleep apnea CPAP - Yes  Fasting Blood Sugar - Does not check blood sugars Checks Blood Sugar _____ times a day  Blood Thinner Instructions: N/A Aspirin Instructions: Last Dose:  Activity level:  Prior to injury was able to go up a flight of stairs and perform activities of daily living without stopping and without symptoms of chest pain or shortness of breath.       Anesthesia review:  N/A  Patient denies shortness of breath, fever, cough and chest pain at PAT appointment   Patient verbalized understanding of instructions that were given to them at the PAT appointment. Patient was also instructed that they will need to review over the PAT instructions again at home before surgery.

## 2021-06-02 NOTE — Patient Instructions (Addendum)
DUE TO COVID-19 ONLY ONE VISITOR IS ALLOWED TO COME WITH YOU AND STAY IN THE WAITING ROOM ONLY DURING PRE OP AND PROCEDURE.   **NO VISITORS ARE ALLOWED IN THE SHORT STAY AREA OR RECOVERY ROOM!!**        Your procedure is scheduled on:  Tuesday, 06-08-21   Report to [facility] Main  Entrance    Report to admitting at 1:15 PM   Call this number if you have problems the morning of surgery [phone]   Do not eat food :After Midnight.   May have liquids until 12:15 PM  day of surgery  CLEAR LIQUID DIET  Foods Allowed                                                                     Foods Excluded  Water, Black Coffee and tea, regular and decaf               liquids that you cannot  Plain Jell-O in any flavor  (No red)                                     see through such as: Fruit ices (not with fruit pulp)                                      milk, soups, orange juice              Iced Popsicles (No red)                                      All solid food                                   Apple juices Sports drinks like Gatorade (No red) Lightly seasoned clear broth or consume(fat free) Sugar, honey syrup      Complete one Ensure drink the morning of surgery at  12:15 PM  the day of surgery.   .     The day of surgery:  Drink ONE (1) Pre-Surgery Clear Ensure the morning of surgery. Drink in one sitting. Do not sip.  This drink was given to you during your hospital  pre-op appointment visit. Nothing else to drink after completing the Pre-Surgery Clear Ensure .          If you have questions, please contact your surgeon's office.     Oral Hygiene is also important to reduce your risk of infection.                                    Remember - BRUSH YOUR TEETH THE MORNING OF SURGERY WITH YOUR REGULAR TOOTHPASTE   Do NOT smoke after Midnight   Take these medicines the morning of surgery with A SIP OF WATER:  Cloinazepam, Omeprazole, Quetiapine  You may not have any metal on your body including jewelry, and body piercing             Do not wear  lotions, powders, cologne, or deodorant               Men may shave face and neck.   Do not bring valuables to the hospital. [place].   Contacts, dentures or bridgework may not be worn into surgery.   Patients discharged the day of surgery will not be allowed to drive home.  Please read over the following fact sheets you were given: IF YOU HAVE QUESTIONS ABOUT YOUR PRE OP INSTRUCTIONS PLEASE CALL [facility] - Preparing for Surgery Before surgery, you can play an important role.  Because skin is not sterile, your skin needs to be as free of germs as possible.  You can reduce the number of germs on your skin by washing with CHG (chlorahexidine gluconate) soap before surgery.  CHG is an antiseptic cleaner which kills germs and bonds with the skin to continue killing germs even after washing. Please DO NOT use if you have an allergy to CHG or antibacterial soaps.  If your skin becomes reddened/irritated stop using the CHG and inform your nurse when you arrive at Short Stay. Do not shave (including legs and underarms) for at least 48 hours prior to the first CHG shower.  You may shave your face/neck.  Please follow these instructions carefully:  1.  Shower with CHG Soap the night before surgery and the  morning of surgery.  2.  If you choose to wash your hair, wash your hair first as usual with your normal  shampoo.  3.  After you shampoo, rinse your hair and body thoroughly to remove the shampoo.                             4.  Use CHG as you would any other liquid soap.  You can apply chg directly to the skin and wash.  Gently with a scrungie or clean washcloth.  5.  Apply the CHG Soap to your body ONLY FROM THE NECK DOWN.   Do   not use on face/ open                           Wound or open sores. Avoid contact with  eyes, ears mouth and   genitals (private parts).                       Wash face,  Genitals (private parts) with your normal soap.             6.  Wash thoroughly, paying special attention to the area where your    surgery  will be performed.  7.  Thoroughly rinse your body with warm water from the neck down.  8.  DO NOT shower/wash with your normal soap after using and rinsing off the CHG Soap.                9.  Pat yourself dry with a clean towel.            10.  Wear clean pajamas.            11.  Place clean sheets on your bed the night  of your first shower and do not  sleep with pets. Day of Surgery : Do not apply any lotions/deodorants the morning of surgery.  Please wear clean clothes to the hospital/surgery center.  FAILURE TO FOLLOW THESE INSTRUCTIONS MAY RESULT IN THE CANCELLATION OF YOUR SURGERY  PATIENT SIGNATURE_________________________________  NURSE SIGNATURE__________________________________  ________________________________________________________________________   Juan [name]  An incentive spirometer is a tool that can help keep your lungs clear and active. This tool measures how well you are filling your lungs with each breath. Taking long deep breaths may help reverse or decrease the chance of developing breathing (pulmonary) problems (especially infection) following: A long period of time when you are unable to move or be active. BEFORE THE PROCEDURE  If the spirometer includes an indicator to show your best effort, your nurse or respiratory therapist will set it to a desired goal. If possible, sit up straight or lean slightly forward. Try not to slouch. Hold the incentive spirometer in an upright position. INSTRUCTIONS FOR USE  Sit on the edge of your bed if possible, or sit up as far as you can in bed or on a chair. Hold the incentive spirometer in an upright position. Breathe out normally. Place the mouthpiece in your mouth and seal your lips tightly  around it. Breathe in slowly and as deeply as possible, raising the piston or the ball toward the top of the column. Hold your breath for 3-5 seconds or for as long as possible. Allow the piston or ball to fall to the bottom of the column. Remove the mouthpiece from your mouth and breathe out normally. Rest for a few seconds and repeat Steps 1 through 7 at least 10 times every 1-2 hours when you are awake. Take your time and take a few normal breaths between deep breaths. The spirometer may include an indicator to show your best effort. Use the indicator as a goal to work toward during each repetition. After each set of 10 deep breaths, practice coughing to be sure your lungs are clear. If you have an incision (the cut made at the time of surgery), support your incision when coughing by placing a pillow or rolled up towels firmly against it. Once you are able to get out of bed, walk around indoors and cough well. You may stop using the incentive spirometer when instructed by your caregiver.  RISKS AND COMPLICATIONS Take your time so you do not get dizzy or light-headed. If you are in pain, you may need to take or ask for pain medication before doing incentive spirometry. It is harder to take a deep breath if you are having pain. AFTER USE Rest and breathe slowly and easily. It can be helpful to keep track of a log of your progress. Your caregiver can provide you with a simple table to help with this. If you are using the spirometer at home, follow these instructions: [place] IF:  You are having difficultly using the spirometer. You have trouble using the spirometer as often as instructed. Your pain medication is not giving enough relief while using the spirometer. You develop fever of 100.5 F (38.1 C) or higher. SEEK IMMEDIATE MEDICAL CARE IF:  You cough up bloody sputum that had not been present before. You develop fever of 102 F (38.9 C) or greater. You develop worsening pain  at or near the incision site. MAKE SURE YOU:  Understand these instructions. Will watch your condition. Will get help right away if you are not doing  well or get worse. Document Released: 03/06/2007 Document Revised: 01/16/2012 Document Reviewed: 05/07/2007 [facility] Patient Information 2014 [place], [place].   ________________________________________________________________________

## 2021-06-03 ENCOUNTER — Encounter (HOSPITAL_COMMUNITY): Payer: Self-pay

## 2021-06-03 ENCOUNTER — Other Ambulatory Visit: Payer: Self-pay

## 2021-06-03 ENCOUNTER — Encounter (HOSPITAL_COMMUNITY)
Admission: RE | Admit: 2021-06-03 | Discharge: 2021-06-03 | Disposition: A | Discharge status: Home / Self Care | Payer: 59 | Source: Ambulatory Visit | Attending: Cardiology | Admitting: Cardiology

## 2021-06-03 DIAGNOSIS — Z01818 Encounter for other preprocedural examination: Secondary | ICD-10-CM | POA: Insufficient documentation

## 2021-06-03 HISTORY — DX: Anxiety disorder, unspecified: F41.9

## 2021-06-03 HISTORY — DX: Depression, unspecified: F32.A

## 2021-06-03 HISTORY — DX: Type 2 diabetes mellitus without complications: E11.9

## 2021-06-03 LAB — GLUCOSE, CAPILLARY: Glucose-Capillary: 104 mg/dL — ABNORMAL HIGH (ref 70–99)

## 2021-06-03 NOTE — Progress Notes (Signed)
[facility]ari bed requested with portable equipment.

## 2021-06-07 NOTE — Progress Notes (Signed)
Spoke with Janett Billow PA with [facility] in regards to pts positive home test 05/28/2021. Pt is out of 10 day window; if pt is asymptomatic they are not required to retest; Pt is a ambulatory surgery.

## 2021-06-08 ENCOUNTER — Encounter (HOSPITAL_COMMUNITY): Payer: Self-pay | Admitting: Orthopedic Surgery

## 2021-06-08 ENCOUNTER — Encounter (HOSPITAL_COMMUNITY)
Admission: RE | Disposition: A | Discharge status: Home / Self Care | Payer: Self-pay | Source: Home / Self Care | Attending: Orthopedic Surgery

## 2021-06-08 ENCOUNTER — Ambulatory Visit (HOSPITAL_COMMUNITY): Payer: 59 | Admitting: Anesthesiology

## 2021-06-08 ENCOUNTER — Ambulatory Visit (HOSPITAL_COMMUNITY)
Admission: RE | Admit: 2021-06-08 | Discharge: 2021-06-08 | Disposition: A | Discharge status: Home / Self Care | Payer: 59 | Attending: Orthopedic Surgery | Admitting: Orthopedic Surgery

## 2021-06-08 DIAGNOSIS — X58XXXA Exposure to other specified factors, initial encounter: Secondary | ICD-10-CM | POA: Diagnosis not present

## 2021-06-08 DIAGNOSIS — Z87891 Personal history of nicotine dependence: Secondary | ICD-10-CM | POA: Diagnosis not present

## 2021-06-08 DIAGNOSIS — Z79899 Other long term (current) drug therapy: Secondary | ICD-10-CM | POA: Insufficient documentation

## 2021-06-08 DIAGNOSIS — S76112A Strain of left quadriceps muscle, fascia and tendon, initial encounter: Secondary | ICD-10-CM | POA: Insufficient documentation

## 2021-06-08 DIAGNOSIS — Z7984 Long term (current) use of oral hypoglycemic drugs: Secondary | ICD-10-CM | POA: Insufficient documentation

## 2021-06-08 DIAGNOSIS — Z91040 Latex allergy status: Secondary | ICD-10-CM | POA: Diagnosis not present

## 2021-06-08 DIAGNOSIS — S8992XA Unspecified injury of left lower leg, initial encounter: Secondary | ICD-10-CM | POA: Diagnosis present

## 2021-06-08 HISTORY — PX: QUADRICEPS TENDON REPAIR: SHX756

## 2021-06-08 LAB — GLUCOSE, CAPILLARY: Glucose-Capillary: 120 mg/dL — ABNORMAL HIGH (ref 70–99)

## 2021-06-08 SURGERY — REPAIR, TENDON, QUADRICEPS
Anesthesia: General | Laterality: Left

## 2021-06-08 MED ORDER — ACETAMINOPHEN 500 MG PO TABS
1000.0000 mg | ORAL_TABLET | Freq: Once | ORAL | Status: AC
  Administered 2021-06-08: 1000 mg via ORAL
  Filled 2021-06-08: qty 2

## 2021-06-08 MED ORDER — ORAL CARE MOUTH RINSE: 15.0000 mL | Freq: Once | OROMUCOSAL | Status: AC

## 2021-06-08 MED ORDER — METHOCARBAMOL 500 MG IVPB - SIMPLE MED
500.0000 mg | Freq: Four times a day (QID) | INTRAVENOUS | Status: DC | PRN
  Administered 2021-06-08: 500 mg via INTRAVENOUS
  Filled 2021-06-08: qty 500

## 2021-06-08 MED ORDER — OXYCODONE HCL 5 MG PO TABS
5.0000 mg | ORAL_TABLET | Freq: Once | ORAL | Status: AC | PRN
  Administered 2021-06-08: 5 mg via ORAL

## 2021-06-08 MED ORDER — FENTANYL CITRATE (PF) 100 MCG/2ML IJ SOLN
INTRAMUSCULAR | Status: AC
  Filled 2021-06-08: qty 2

## 2021-06-08 MED ORDER — BUPIVACAINE-EPINEPHRINE (PF) 0.25% -1:200000 IJ SOLN
INTRAMUSCULAR | Status: DC | PRN
  Administered 2021-06-08: 30 mL

## 2021-06-08 MED ORDER — TIZANIDINE HCL 2 MG PO TABS: 2.0000 mg | ORAL_TABLET | Freq: Three times a day (TID) | ORAL | 0 refills | Status: AC | PRN

## 2021-06-08 MED ORDER — ONDANSETRON HCL 4 MG/2ML IJ SOLN
INTRAMUSCULAR | Status: DC | PRN
  Administered 2021-06-08: 4 mg via INTRAVENOUS

## 2021-06-08 MED ORDER — LABETALOL HCL 5 MG/ML IV SOLN
INTRAVENOUS | Status: AC
  Filled 2021-06-08: qty 4

## 2021-06-08 MED ORDER — PROMETHAZINE HCL 25 MG/ML IJ SOLN: 6.2500 mg | INTRAMUSCULAR | Status: DC | PRN

## 2021-06-08 MED ORDER — HYDRALAZINE HCL 20 MG/ML IJ SOLN
INTRAMUSCULAR | Status: AC
  Administered 2021-06-08: 10 mg
  Filled 2021-06-08: qty 1

## 2021-06-08 MED ORDER — FENTANYL CITRATE (PF) 250 MCG/5ML IJ SOLN
INTRAMUSCULAR | Status: AC
  Filled 2021-06-08: qty 5

## 2021-06-08 MED ORDER — PROPOFOL 10 MG/ML IV BOLUS
INTRAVENOUS | Status: DC | PRN
  Administered 2021-06-08: 200 mg via INTRAVENOUS

## 2021-06-08 MED ORDER — DEXMEDETOMIDINE HCL 200 MCG/2ML IV SOLN
INTRAVENOUS | Status: DC | PRN
  Administered 2021-06-08: 12 ug via INTRAVENOUS
  Administered 2021-06-08: 8 ug via INTRAVENOUS

## 2021-06-08 MED ORDER — ONDANSETRON HCL 4 MG/2ML IJ SOLN
INTRAMUSCULAR | Status: AC
  Filled 2021-06-08: qty 2

## 2021-06-08 MED ORDER — BUPIVACAINE-EPINEPHRINE (PF) 0.25% -1:200000 IJ SOLN
INTRAMUSCULAR | Status: AC
  Filled 2021-06-08: qty 30

## 2021-06-08 MED ORDER — CEFAZOLIN IN SODIUM CHLORIDE 3-0.9 GM/100ML-% IV SOLN
3.0000 g | INTRAVENOUS | Status: DC
  Filled 2021-06-08: qty 100

## 2021-06-08 MED ORDER — FENTANYL CITRATE (PF) 100 MCG/2ML IJ SOLN
25.0000 ug | INTRAMUSCULAR | Status: DC | PRN
  Administered 2021-06-08 (×3): 50 ug via INTRAVENOUS

## 2021-06-08 MED ORDER — PROPOFOL 10 MG/ML IV BOLUS
INTRAVENOUS | Status: AC
  Filled 2021-06-08: qty 20

## 2021-06-08 MED ORDER — METHOCARBAMOL 500 MG PO TABS: 500.0000 mg | ORAL_TABLET | Freq: Four times a day (QID) | ORAL | Status: DC | PRN

## 2021-06-08 MED ORDER — ROCURONIUM BROMIDE 10 MG/ML (PF) SYRINGE
PREFILLED_SYRINGE | INTRAVENOUS | Status: DC | PRN
  Administered 2021-06-08: 160 mg via INTRAVENOUS

## 2021-06-08 MED ORDER — OXYCODONE HCL 5 MG/5ML PO SOLN: 5.0000 mg | Freq: Once | ORAL | Status: AC | PRN

## 2021-06-08 MED ORDER — CHLORHEXIDINE GLUCONATE 0.12 % MT SOLN
15.0000 mL | Freq: Once | OROMUCOSAL | Status: AC
  Administered 2021-06-08: 15 mL via OROMUCOSAL

## 2021-06-08 MED ORDER — DEXMEDETOMIDINE (PRECEDEX) IN NS 20 MCG/5ML (4 MCG/ML) IV SYRINGE
PREFILLED_SYRINGE | INTRAVENOUS | Status: AC
  Filled 2021-06-08: qty 5

## 2021-06-08 MED ORDER — 0.9 % SODIUM CHLORIDE (POUR BTL) OPTIME
TOPICAL | Status: DC | PRN
  Administered 2021-06-08: 1000 mL

## 2021-06-08 MED ORDER — OXYCODONE-ACETAMINOPHEN 5-325 MG PO TABS: 1.0000 | ORAL_TABLET | ORAL | 0 refills | Status: AC | PRN

## 2021-06-08 MED ORDER — PHENYLEPHRINE 40 MCG/ML (10ML) SYRINGE FOR IV PUSH (FOR BLOOD PRESSURE SUPPORT)
PREFILLED_SYRINGE | INTRAVENOUS | Status: DC | PRN
  Administered 2021-06-08: 120 ug via INTRAVENOUS

## 2021-06-08 MED ORDER — LABETALOL HCL 5 MG/ML IV SOLN
5.0000 mg | INTRAVENOUS | Status: AC | PRN
  Administered 2021-06-08 (×4): 5 mg via INTRAVENOUS

## 2021-06-08 MED ORDER — MIDAZOLAM HCL 2 MG/2ML IJ SOLN
INTRAMUSCULAR | Status: AC
  Filled 2021-06-08: qty 2

## 2021-06-08 MED ORDER — DEXAMETHASONE SODIUM PHOSPHATE 10 MG/ML IJ SOLN
INTRAMUSCULAR | Status: DC | PRN
  Administered 2021-06-08: 10 mg via INTRAVENOUS

## 2021-06-08 MED ORDER — MIDAZOLAM HCL 5 MG/5ML IJ SOLN
INTRAMUSCULAR | Status: DC | PRN
  Administered 2021-06-08: 2 mg via INTRAVENOUS

## 2021-06-08 MED ORDER — LACTATED RINGERS IV SOLN
INTRAVENOUS | Status: DC
Start: 2021-06-08 — End: 2021-06-08

## 2021-06-08 MED ORDER — LIDOCAINE 2% (20 MG/ML) 5 ML SYRINGE
INTRAMUSCULAR | Status: DC | PRN
  Administered 2021-06-08: 100 mg via INTRAVENOUS

## 2021-06-08 MED ORDER — OXYCODONE HCL 5 MG PO TABS
ORAL_TABLET | ORAL | Status: AC
  Filled 2021-06-08: qty 1

## 2021-06-08 MED ORDER — FENTANYL CITRATE (PF) 100 MCG/2ML IJ SOLN
INTRAMUSCULAR | Status: DC | PRN
  Administered 2021-06-08: 100 ug via INTRAVENOUS
  Administered 2021-06-08 (×4): 50 ug via INTRAVENOUS

## 2021-06-08 MED ORDER — PHENYLEPHRINE HCL-NACL 20-0.9 MG/250ML-% IV SOLN
INTRAVENOUS | Status: AC
  Filled 2021-06-08: qty 250

## 2021-06-08 SURGICAL SUPPLY — 49 items
BAG COUNTER SPONGE SURGICOUNT (BAG) IMPLANT
BAG SPEC THK2 15X12 ZIP CLS (MISCELLANEOUS) ×1
BAG SPNG CNTER NS LX DISP (BAG)
BAG ZIPLOCK 12X15 (MISCELLANEOUS) ×2 IMPLANT
BNDG CMPR MED 10X6 ELC LF (GAUZE/BANDAGES/DRESSINGS) ×1
BNDG ELASTIC 6X10 VLCR STRL LF (GAUZE/BANDAGES/DRESSINGS) ×1 IMPLANT
COVER SURGICAL LIGHT HANDLE (MISCELLANEOUS) ×2 IMPLANT
DRAPE U-SHAPE 47X51 STRL (DRAPES) ×2 IMPLANT
DRSG AQUACEL AG ADV 3.5X10 (GAUZE/BANDAGES/DRESSINGS) ×1 IMPLANT
DURAPREP 26ML APPLICATOR (WOUND CARE) ×2 IMPLANT
ELECT REM PT RETURN 15FT ADLT (MISCELLANEOUS) ×2 IMPLANT
GLOVE SRG 8 PF TXTR STRL LF DI (GLOVE) ×1 IMPLANT
GLOVE SURG ENC MOIS LTX SZ6.5 (GLOVE) ×2 IMPLANT
GLOVE SURG ENC MOIS LTX SZ7.5 (GLOVE) ×2 IMPLANT
GLOVE SURG ORTHO LTX SZ8 (GLOVE) ×2 IMPLANT
GLOVE SURG UNDER POLY LF SZ6.5 (GLOVE) ×2 IMPLANT
GLOVE SURG UNDER POLY LF SZ8 (GLOVE) ×2
GOWN STRL REUS W/TWL LRG LVL3 (GOWN DISPOSABLE) ×2 IMPLANT
GOWN STRL REUS W/TWL XL LVL3 (GOWN DISPOSABLE) ×2 IMPLANT
IMMOBILIZER KNEE 20 (SOFTGOODS) ×2
IMMOBILIZER KNEE 20 THIGH 36 (SOFTGOODS) ×1 IMPLANT
KIT BASIN OR (CUSTOM PROCEDURE TRAY) ×2 IMPLANT
KIT TURNOVER KIT A (KITS) ×2 IMPLANT
MANIFOLD NEPTUNE II (INSTRUMENTS) ×2 IMPLANT
NDL MA TROC 1/2 CIR (NEEDLE) ×1 IMPLANT
NDL SPNL 18GX3.5 QUINCKE PK (NEEDLE) ×1 IMPLANT
NEEDLE MA TROC 1/2 CIR (NEEDLE) ×2 IMPLANT
NEEDLE SPNL 18GX3.5 QUINCKE PK (NEEDLE) ×2 IMPLANT
NS IRRIG 1000ML POUR BTL (IV SOLUTION) ×2 IMPLANT
PACK ORTHO EXTREMITY (CUSTOM PROCEDURE TRAY) ×2 IMPLANT
PADDING CAST COTTON 6X4 STRL (CAST SUPPLIES) ×1 IMPLANT
PASSER SUT SWANSON 36MM LOOP (INSTRUMENTS) ×2 IMPLANT
PENCIL SMOKE EVACUATOR (MISCELLANEOUS) IMPLANT
PROTECTOR NERVE ULNAR (MISCELLANEOUS) ×2 IMPLANT
SPONGE T-LAP 18X18 ~~LOC~~+RFID (SPONGE) ×2 IMPLANT
STOCKINETTE 8 INCH (MISCELLANEOUS) ×1 IMPLANT
STRIP CLOSURE SKIN 1/2X4 (GAUZE/BANDAGES/DRESSINGS) ×2 IMPLANT
SUT ETHIBOND 5 LR DA (SUTURE) ×4 IMPLANT
SUT MNCRL AB 4-0 PS2 18 (SUTURE) ×2 IMPLANT
SUT VIC AB 0 CT1 27 (SUTURE) ×4
SUT VIC AB 0 CT1 27XBRD ANTBC (SUTURE) ×2 IMPLANT
SUT VIC AB 2-0 CT1 27 (SUTURE) ×4
SUT VIC AB 2-0 CT1 TAPERPNT 27 (SUTURE) ×2 IMPLANT
SUTURE TAPE 1.3 40 TPR END (SUTURE) IMPLANT
SUTURETAPE 1.3 40 TPR END (SUTURE) ×4
SYR 30ML LL (SYRINGE) ×2 IMPLANT
TAPE FIBER 2MM 7IN #2 BLUE (SUTURE) ×2 IMPLANT
TAPE STRIPS DRAPE STRL (GAUZE/BANDAGES/DRESSINGS) ×1 IMPLANT
WATER STERILE IRR 1000ML POUR (IV SOLUTION) ×2 IMPLANT

## 2021-06-08 NOTE — Evaluation (Signed)
Physical Therapy Evaluation Patient Details Name: Juan [name] MRN: [id number] DOB: [birth year] Today's Date: 06/08/2021   History of Present Illness  Patient is 60 y.o. male s/p Lt quad tendon repair on 06/08/21 with PMH significant for DM, HTN, GERD, anxiety, depression.   Clinical Impression  Juan [name] is a 60 y.o. male POD 0 s/p Lt quad tendon repair. Patient reports independence with mobility at baseline and use of single crutch for mobility since injury. Patient is now limited by functional impairments (see PT problem list below) and requires min guard/supervision for transfers and gait with bil crutches. Patient was able to ambulate ~80 feet with bil crutches and min guard/supervision and cues for safe crutch management. Patient balance improved with bil crutches vs single crutch and educated on using two for improved safety. Patient educated on safe sequencing for stair mobility and verbalized safe guarding position for people assisting with mobility. Reviewed precautions with knee brace to remain On At ALL Times and to keep locked in extension. Educated on brace positioning and on how to adjust straps. Educated on importance of checking skin for irritation from brace every day. Patient will benefit from continued skilled PT interventions to address impairments and progress towards PLOF. Patient has met mobility goals at adequate level for discharge home; will continue to follow if pt continues acute stay to progress towards Mod I goals.     Follow Up Recommendations Follow surgeon's recommendation for DC plan and follow-up therapies;Outpatient PT    Equipment Recommendations  None recommended by PT    Recommendations for Other Services       Precautions / Restrictions Precautions Precaution Comments: No Lt knee flexion at all Required Braces or Orthoses: Knee Immobilizer - Left (Bledso brace locked at 0) Knee Immobilizer - Left: On at all times Restrictions Weight Bearing  Restrictions: No Other Position/Activity Restrictions: WBAT      Mobility  Bed Mobility Overal bed mobility: Needs Assistance Bed Mobility: Supine to Sit     Supine to sit: Min assist;HOB elevated     General bed mobility comments: Cues for keeping Lt knee locked in extension and brace on at all times. Assist to bring Lt LE off EOB.    Transfers Overall transfer level: Needs assistance Equipment used: Crutches Transfers: Sit to/from Stand Sit to Stand: Min guard;Supervision;From elevated surface         General transfer comment: cues for safe technique to rise and sit with bil crutches. cues for transition from hand to under arms in standing. no assist needed for power up from EOB.  Ambulation/Gait Ambulation/Gait assistance: Min guard;Supervision Gait Distance (Feet): 80 Feet Assistive device: Crutches Gait Pattern/deviations: Step-to pattern;Decreased stride length;Decreased weight shift to left Gait velocity: decr   General Gait Details: cues for sequencing bil crutches with Lt LE to reduce Wb for pain management. pt attempted gait with single crutch however more stable with bil crutches. no overt LOB noted throughout.  Stairs Stairs: Yes Stairs assistance: Min guard Stair Management: Two rails;Step to pattern;Forwards Number of Stairs: 3 General stair comments: cuse for sequence "up with good, down with bad" no overt LOB noted and pt steady and wife provided assist for guarding and crutch management.  Wheelchair Mobility    Modified Rankin (Stroke Patients Only)       Balance Overall balance assessment: Mild deficits observed, not formally tested;Needs assistance Sitting-balance support: Feet supported Sitting balance-Leahy Scale: Good     Standing balance support: During functional activity;Bilateral upper extremity supported Standing  balance-Leahy Scale: Fair                               Pertinent Vitals/Pain Pain Assessment:  0-10 Pain Score: 5  Pain Location: Lt knee Pain Descriptors / Indicators: Aching;Discomfort Pain Intervention(s): Limited activity within patient's tolerance;Monitored during session;Premedicated before session;Repositioned;Ice applied    Home Living Family/patient expects to be discharged to:: Private residence Living Arrangements: Spouse/significant other;Children Available Help at Discharge: Family Type of Home: House Home Access: Stairs to enter Entrance Stairs-Rails: Can reach both;Right;Left Entrance Stairs-Number of Steps: 7 Home Layout: One level Home Equipment: Toilet riser;Crutches;Grab bars - tub/shower      Prior Function Level of Independence: Independent with assistive device(s)         Comments: using crutches since injury July 10th     Hand Dominance   Dominant Hand: Right    Extremity/Trunk Assessment   Upper Extremity Assessment Upper Extremity Assessment: Overall WFL for tasks assessed    Lower Extremity Assessment Lower Extremity Assessment: Overall WFL for tasks assessed;LLE deficits/detail LLE: Unable to fully assess due to immobilization LLE Sensation: WNL    Cervical / Trunk Assessment Cervical / Trunk Assessment: Normal  Communication   Communication: No difficulties  Cognition Arousal/Alertness: Awake/alert Behavior During Therapy: WFL for tasks assessed/performed Overall Cognitive Status: Within Functional Limits for tasks assessed                                        General Comments      Exercises General Exercises - Lower Extremity Ankle Circles/Pumps: AROM;Both;Seated Hip ABduction/ADduction: AROM;Seated   Assessment/Plan    PT Assessment Patient needs continued PT services  PT Problem List Decreased strength;Decreased range of motion;Decreased activity tolerance;Decreased balance;Decreased mobility;Decreased knowledge of use of DME;Decreased safety awareness;Decreased knowledge of  precautions;Obesity;Pain       PT Treatment Interventions DME instruction;Gait training;Stair training;Functional mobility training;Therapeutic activities;Therapeutic exercise;Balance training;Neuromuscular re-education;Patient/family education    PT Goals (Current goals can be found in the Care Plan section)  Acute Rehab PT Goals Patient Stated Goal: get back independence PT Goal Formulation: With patient Time For Goal Achievement: 06/15/21 Potential to Achieve Goals: Good    Frequency Min 5X/week   Barriers to discharge        Co-evaluation               AM-PAC PT "6 Clicks" Mobility  Outcome Measure Help needed turning from your back to your side while in a flat bed without using bedrails?: A Little Help needed moving from lying on your back to sitting on the side of a flat bed without using bedrails?: A Little Help needed moving to and from a bed to a chair (including a wheelchair)?: A Little Help needed standing up from a chair using your arms (e.g., wheelchair or bedside chair)?: A Little Help needed to walk in hospital room?: A Little Help needed climbing 3-5 steps with a railing? : A Little 6 Click Score: 18    End of Session Equipment Utilized During Treatment: Gait belt;Left knee immobilizer Activity Tolerance: Patient tolerated treatment well Patient left: in chair;with call bell/phone within reach;with family/visitor present Nurse Communication: Mobility status PT Visit Diagnosis: Muscle weakness (generalized) (M62.81);Difficulty in walking, not elsewhere classified (R26.2)    Time: 0347-4259 PT Time Calculation (min) (ACUTE ONLY): 34 min   Charges:   PT  Evaluation $PT Eval Low Complexity: 1 Low PT Treatments $Gait Training: 8-22 mins        Verner Mould, DPT Acute Rehabilitation Services Office [phone] Pager [phone]   Jacques Navy 06/08/2021, 6:53 PM

## 2021-06-08 NOTE — Anesthesia Preprocedure Evaluation (Addendum)
Anesthesia Evaluation  Patient identified by MRN, date of birth, ID band Patient awake    Reviewed: Allergy & Precautions, H&P , NPO status , Patient's Chart, lab work & pertinent test results  History of Anesthesia Complications (+) PONV and history of anesthetic complications  Airway Mallampati: III  TM Distance: >3 FB Neck ROM: Full    Dental no notable dental hx. (+) Teeth Intact, Dental Advisory Given   Pulmonary sleep apnea , former smoker,    Pulmonary exam normal breath sounds clear to auscultation       Cardiovascular hypertension, Pt. on medications  Rhythm:Regular Rate:Normal     Neuro/Psych Anxiety Depression negative neurological ROS     GI/Hepatic Neg liver ROS, GERD  Medicated,  Endo/Other  diabetes, Type 2, Oral Hypoglycemic AgentsMorbid obesity  Renal/GU negative Renal ROS  negative genitourinary   Musculoskeletal LEFT QUAD TENDON RUPTURE   Abdominal   Peds  Hematology negative hematology ROS (+)   Anesthesia Other Findings Day of surgery medications reviewed with patient.  Reproductive/Obstetrics negative OB ROS                          Anesthesia Physical Anesthesia Plan  ASA: 3  Anesthesia Plan: General   Post-op Pain Management:    Induction: Intravenous  PONV Risk Score and Plan: 3 and Treatment may vary due to age or medical condition, Ondansetron, Dexamethasone and Midazolam  Airway Management Planned: Oral ETT  Additional Equipment: None  Intra-op Plan:   Post-operative Plan: Extubation in OR  Informed Consent: I have reviewed the patients History and Physical, chart, labs and discussed the procedure including the risks, benefits and alternatives for the proposed anesthesia with the patient or authorized representative who has indicated his/her understanding and acceptance.     Dental advisory given  Plan Discussed with: CRNA  Anesthesia Plan  Comments:        Anesthesia Quick Evaluation

## 2021-06-08 NOTE — Discharge Instructions (Signed)
Discharge Instructions after Quad Tendon Repair   You may shower the day after surgery. The dressing is waterproof. Do not remove the dressing as it will stay on until your follow up in the office.  Keep the knee brace on at all times except when bathing/showering. Keep the leg straight when showering. For this reason, it may be easier to take a sponge bath at first.  Apply ice to the knee 3 times per day for 30 minutes for the first 1 week until your knee is feeling comfortable again. Do not use heat.  Pain medicine has been prescribed for you.  Use your medicine as needed over the first 48 hours, and then you can begin to taper your use. You may take Extra Strength Tylenol or Tylenol only in place of the pain pills.  Take one '81mg'$  aspirin daily for 3 weeks post-operatively unless it upsets your stomach.   Please call [phone] during normal business hours or [phone] after hours for any problems. Including the following:  - excessive redness of the incisions - drainage for more than 4 days - fever of more than 101.5 F  *Please note that pain medications will not be refilled after hours or on weekends.

## 2021-06-08 NOTE — Anesthesia Procedure Notes (Signed)
Procedure Name: Intubation Date/Time: 06/08/2021 2:56 PM Performed by: Gean Maidens, CRNA Pre-anesthesia Checklist: Patient identified, Emergency Drugs available, Suction available, Patient being monitored and Timeout performed Patient Re-evaluated:Patient Re-evaluated prior to induction Oxygen Delivery Method: Circle system utilized Preoxygenation: Pre-oxygenation with 100% oxygen Induction Type: IV induction Ventilation: Mask ventilation without difficulty Laryngoscope Size: Mac and 4 Grade View: Grade II Tube type: Oral Tube size: 7.5 mm Number of attempts: 1 Airway Equipment and Method: Stylet Placement Confirmation: ETT inserted through vocal cords under direct vision, positive ETCO2 and breath sounds checked- equal and bilateral Secured at: 23 cm Tube secured with: Tape Dental Injury: Teeth and Oropharynx as per pre-operative assessment

## 2021-06-08 NOTE — Anesthesia Postprocedure Evaluation (Signed)
Anesthesia Post Note  Patient: Juan [name]  Procedure(s) Performed: REPAIR QUADRICEP TENDON (Left)     Patient location during evaluation: PACU Anesthesia Type: General Level of consciousness: awake and alert Pain management: pain level controlled Vital Signs Assessment: post-procedure vital signs reviewed and stable Respiratory status: spontaneous breathing, nonlabored ventilation and respiratory function stable Cardiovascular status: blood pressure returned to baseline and stable Postop Assessment: no apparent nausea or vomiting Anesthetic complications: no   No notable events documented.  Last Vitals:  Vitals:   06/08/21 1815 06/08/21 1830  BP: (!) 150/89 (!) 148/86  Pulse:    Resp:    Temp:    SpO2:      Last Pain:  Vitals:   06/08/21 1745  TempSrc:   PainSc: 4                  Jahmeer Porche,W. EDMOND

## 2021-06-08 NOTE — Op Note (Signed)
Procedure(s): REPAIR QUADRICEP TENDON Procedure Note  ADAM RENTSCHLER male 60 y.o. 06/08/2021   Preoperative diagnosis: Left quadriceps tendon rupture  Postoperative diagnosis: Same  Procedure(s) and Anesthesia Type:    * REPAIR QUADRICEP TENDON - Choice  Surgeon(s) and Role:    Tania Ade, MD - Primary   Indications:  60 y.o. male  With left quadriceps tendon rupture.  Indicated for surgical treatment to restore function and strength.     Surgeon: [name]   Assistants: Sheryle Hail PA-C Amber was present and scrubbed throughout the procedure and was essential in positioning, retraction, exposure, and closure)  Anesthesia: General endotracheal anesthesia    Procedure Detail  Findings: Essentially a complete rupture of the quadriceps tendon which was repaired using 2 suture tapes through bone tunnels with excellent approximation.  Estimated Blood Loss:  less than 50 mL         Drains: none  Blood Given: none          Specimens: none        Complications:  * No complications entered in OR log *         Disposition: PACU - hemodynamically stable.         Condition: stable   Procedure:   The patient was identified in the preoperative holding area where I personally marked the operative extremity and then was taken to the operating room where He was transferred to the operative table with all extremities padded to   protect against neurovascular compromise.  The patient received 1 g of   IV Ancef preoperatively.  After general endotracheal anesthesia was   induced without complication, the left lower extremity was prepped and draped in the standard sterile fashion.  The appropriate time out procedure was carried out with myself the OR staff and anesthesia staff all verifying site, side and procedure.  An esmarch dressing was used to exsanguinate the limb, and the tourniquet was elevated to 350 mmHg.  An approximately 12-cm incision was made over  the anterior knee.   Dissection was carried down through the subcutaneous tissue and the quadriceps tendon was identified.  The tear was essentially a complete tear without significant retraction.  There really were no fibers in continuity.  Fortunately there was minimal retraction.  The tendon edge was freshened and the superior aspect of the patella was debrided of soft tissue down to a bleeding bony healing surface.  At this point the tendon was prepared proximally using 2 suture tapes with running locking Krakw suture configurations.  This allowed a total of 4 suture strands which were then passed through 3 bone tunnels in the patella and tied over bone bridge at the inferior patellar margin.  The knee was held in full extension while these were tied and the quadriceps tendon approximated nicely back to the superior aspect of the patella with compression.  At this point medial and lateral retinacular tissue was closed with 0 Vicryl suture.  The wound was irrigated and then closed in layers with 0 Vicryl 2-0 Vicryl and 4-0 Monocryl.  Steri-Strips were applied as well as a waterproof dressing.  The   patient was then placed in a hinged knee brace, allowed to awaken from   general anesthesia, transferred to stretcher, and taken to the recovery   room in stable condition.      POSTOPERATIVE PLAN: The patient will be discharged home today with his wife.  He will take an aspirin a day for DVT prophylaxis.  He can be weightbearing as tolerated in the brace.

## 2021-06-08 NOTE — H&P (Signed)
Juan [name] is an 60 y.o. male.   Chief Complaint: L knee injury  HPI: L knee high grad partial quadriceps tenon tear  Past Medical History:  Diagnosis Date   Anxiety    Depression    Diabetes mellitus without complication (HCC)    GERD (gastroesophageal reflux disease)    mild   Hypercholesterolemia    Hypertension    PONV (postoperative nausea and vomiting)    Sleep apnea     Past Surgical History:  Procedure Laterality Date   CHOLECYSTECTOMY     COLONOSCOPY  2017   KNEE ARTHROSCOPY WITH MENISCAL REPAIR Right    VASECTOMY     WISDOM TOOTH EXTRACTION Bilateral    XI ROBOTIC ASSISTED SIMPLE PROSTATECTOMY N/A 06/13/2019   Procedure: XI ROBOTIC ASSISTED SIMPLE PROSTATECTOMY;  Surgeon: Cleon Gustin, MD;  Location: [facility];  Service: Urology;  Laterality: N/A;  3 HRS    Family History  Problem Relation Age of Onset   Hypertension Mother    Hypertension Father    Social History:  reports that he quit smoking about 23 years ago. His smoking use included cigarettes. He has a 5.00 pack-year smoking history. He has never used smokeless tobacco. He reports current alcohol use. He reports that he does not use drugs.  Allergies:  Allergies  Allergen Reactions   Latex Rash    Medications Prior to Admission  Medication Sig Dispense Refill   clonazePAM (KLONOPIN) 1 MG tablet Take 1-2 mg by mouth See admin instructions. Take 1 mg in the morning and 2 mg at bedtime     lisinopril (PRINIVIL,ZESTRIL) 20 MG tablet Take 20 mg by mouth daily.     metFORMIN (GLUCOPHAGE) 500 MG tablet Take by mouth 2 (two) times daily with a meal.     mirtazapine (REMERON) 30 MG tablet Take 30 mg by mouth at bedtime.     omeprazole (PRILOSEC) 20 MG capsule Take 20 mg by mouth daily as needed (acid reflux).     oxymetazoline (AFRIN) 0.05 % nasal spray Place 1 spray into both nostrils 2 (two) times daily as needed for congestion.     pravastatin (PRAVACHOL) 40 MG tablet Take 40 mg by mouth at bedtime.       QUEtiapine (SEROQUEL) 200 MG tablet Take 200 mg by mouth daily.      No results found for this or any previous visit (from the past 48 hour(s)). No results found.  Review of Systems  Respiratory:  Positive for cough.   All other systems reviewed and are negative.  Blood pressure (!) 195/142, pulse (!) 103, temperature 98 F (36.7 C), temperature source Oral, resp. rate 18, SpO2 98 %. Physical Exam HENT:     Head: Atraumatic.  Eyes:     Extraocular Movements: Extraocular movements intact.  Cardiovascular:     Pulses: Normal pulses.  Musculoskeletal:     Comments: LLE with large knee effusion, TTP with palpable gap at quad insertion on patella.  Neurological:     Mental Status: He is alert.     Assessment/Plan L knee high grad partial quadriceps tenon tear Plan L knee quad tendon repair Risks / benefits of surgery discussed Consent on chart  NPO for OR Preop antibiotics   [name], MD 06/08/2021, 2:16 PM

## 2021-06-08 NOTE — Transfer of Care (Signed)
Immediate Anesthesia Transfer of Care Note  Patient: BLAZE WOLOSZYK  Procedure(s) Performed: REPAIR QUADRICEP TENDON (Left)  Patient Location: PACU  Anesthesia Type:General  Level of Consciousness: sedated and responds to stimulation  Airway & Oxygen Therapy: Patient Spontanous Breathing and Patient connected to face mask oxygen  Post-op Assessment: Report given to RN and Post -op Vital signs reviewed and stable  Post vital signs: Reviewed and stable  Last Vitals:  Vitals Value Taken Time  BP 161/112 06/08/21 1610  Temp    Pulse 86 06/08/21 1612  Resp 22 06/08/21 1612  SpO2 99 % 06/08/21 1612  Vitals shown include unvalidated device data.  Last Pain:  Vitals:   06/08/21 1344  TempSrc:   PainSc: 0-No pain         Complications: No notable events documented.

## 2021-06-09 ENCOUNTER — Encounter (HOSPITAL_COMMUNITY): Payer: Self-pay | Admitting: Orthopedic Surgery

## 2022-04-15 ENCOUNTER — Other Ambulatory Visit: Payer: 59

## 2022-04-18 ENCOUNTER — Other Ambulatory Visit: Payer: 59

## 2022-04-22 ENCOUNTER — Ambulatory Visit: Payer: 59 | Admitting: Urology

## 2022-04-25 ENCOUNTER — Encounter: Payer: Self-pay | Admitting: Urology

## 2022-04-25 ENCOUNTER — Ambulatory Visit (INDEPENDENT_AMBULATORY_CARE_PROVIDER_SITE_OTHER): Payer: 59 | Admitting: Urology

## 2022-04-25 ENCOUNTER — Other Ambulatory Visit: Payer: Self-pay

## 2022-04-25 VITALS — BP 149/89 | HR 86

## 2022-04-25 DIAGNOSIS — N401 Enlarged prostate with lower urinary tract symptoms: Secondary | ICD-10-CM | POA: Diagnosis not present

## 2022-04-25 DIAGNOSIS — C61 Malignant neoplasm of prostate: Secondary | ICD-10-CM

## 2022-04-25 DIAGNOSIS — Z8546 Personal history of malignant neoplasm of prostate: Secondary | ICD-10-CM

## 2022-04-25 DIAGNOSIS — R351 Nocturia: Secondary | ICD-10-CM | POA: Diagnosis not present

## 2022-04-25 LAB — MICROSCOPIC EXAMINATION
Bacteria, UA: NONE SEEN
WBC, UA: NONE SEEN /hpf (ref 0–5)

## 2022-04-25 LAB — URINALYSIS, ROUTINE W REFLEX MICROSCOPIC
Bilirubin, UA: NEGATIVE
Glucose, UA: NEGATIVE
Ketones, UA: NEGATIVE
Leukocytes,UA: NEGATIVE
Nitrite, UA: NEGATIVE
Protein,UA: NEGATIVE
Specific Gravity, UA: 1.02 (ref 1.005–1.030)
Urobilinogen, Ur: 0.2 mg/dL (ref 0.2–1.0)
pH, UA: 6 (ref 5.0–7.5)

## 2022-04-25 NOTE — Patient Instructions (Signed)

## 2022-04-25 NOTE — Progress Notes (Signed)
04/25/2022 10:51 AM   Juan [name] 1961/06/02 [id number]  Referring provider: [facility], [facility] [address] [place],  [place] [address]  Followup prostate cancer and BPH   HPI: Juan [name] is a 61yo here for followup for prostate cancer and BPH. No recent PSA.  IPSS 3 QOL 0. Nocturia 2x. No other complaints today   PMH: Past Medical History:  Diagnosis Date   Anxiety    Depression    Diabetes mellitus without complication (HCC)    GERD (gastroesophageal reflux disease)    mild   Hypercholesterolemia    Hypertension    PONV (postoperative nausea and vomiting)    Sleep apnea     Surgical History: Past Surgical History:  Procedure Laterality Date   CHOLECYSTECTOMY     COLONOSCOPY  2017   KNEE ARTHROSCOPY WITH MENISCAL REPAIR Right    QUADRICEPS TENDON REPAIR Left 06/08/2021   Procedure: REPAIR QUADRICEP TENDON;  Surgeon: Tania Ade, MD;  Location: [facility];  Service: Orthopedics;  Laterality: Left;   VASECTOMY     WISDOM TOOTH EXTRACTION Bilateral    XI ROBOTIC ASSISTED SIMPLE PROSTATECTOMY N/A 06/13/2019   Procedure: XI ROBOTIC ASSISTED SIMPLE PROSTATECTOMY;  Surgeon: Cleon Gustin, MD;  Location: [facility];  Service: Urology;  Laterality: N/A;  3 HRS    Home Medications:  Allergies as of 04/25/2022       Reactions   Latex Rash        Medication List        Accurate as of April 25, 2022 10:51 AM. If you have any questions, ask your nurse or doctor.          clonazePAM 1 MG tablet Commonly known as: KLONOPIN Take 1-2 mg by mouth See admin instructions. Take 1 mg in the morning and 2 mg at bedtime   clonazePAM 2 MG tablet Commonly known as: KLONOPIN Take by mouth.   lisinopril 20 MG tablet Commonly known as: ZESTRIL Take 20 mg by mouth daily.   metFORMIN 500 MG tablet Commonly known as: GLUCOPHAGE Take by mouth 2 (two) times daily with a meal.   mirtazapine 30 MG tablet Commonly known as: REMERON Take 30 mg  by mouth at bedtime.   omeprazole 20 MG capsule Commonly known as: PRILOSEC Take 20 mg by mouth daily as needed (acid reflux).   oxyCODONE-acetaminophen 5-325 MG tablet Commonly known as: Percocet Take 1 tablet by mouth every 4 (four) hours as needed for severe pain.   oxymetazoline 0.05 % nasal spray Commonly known as: AFRIN Place 1 spray into both nostrils 2 (two) times daily as needed for congestion.   pravastatin 40 MG tablet Commonly known as: PRAVACHOL Take 40 mg by mouth at bedtime.   QUEtiapine 200 MG tablet Commonly known as: SEROQUEL Take 200 mg by mouth daily.   tiZANidine 2 MG tablet Commonly known as: ZANAFLEX Take 1 tablet (2 mg total) by mouth every 8 (eight) hours as needed for muscle spasms.   ziprasidone 40 MG capsule Commonly known as: GEODON Take 40 mg by mouth 2 (two) times daily.        Allergies:  Allergies  Allergen Reactions   Latex Rash    Family History: Family History  Problem Relation Age of Onset   Hypertension Mother    Hypertension Father     Social History:  reports that he quit smoking about 24 years ago. His smoking use included cigarettes. He has a 5.00 pack-year smoking history. He has never  used smokeless tobacco. He reports current alcohol use. He reports that he does not use drugs.  ROS: All other review of systems were reviewed and are negative except what is noted above in HPI  Physical Exam: BP (!) 149/89   Pulse 86   Constitutional:  Alert and oriented, No acute distress. HEENT:  AT, moist mucus membranes.  Trachea midline, no masses. Cardiovascular: No clubbing, cyanosis, or edema. Respiratory: Normal respiratory effort, no increased work of breathing. GI: Abdomen is soft, nontender, nondistended, no abdominal masses GU: No CVA tenderness.  Lymph: No cervical or inguinal lymphadenopathy. Skin: No rashes, bruises or suspicious lesions. Neurologic: Grossly intact, no focal deficits, moving all 4  extremities. Psychiatric: Normal mood and affect.  Laboratory Data: Lab Results  Component Value Date   WBC 8.8 05/16/2021   HGB 16.2 05/16/2021   HCT 51.4 05/16/2021   MCV 90.5 05/16/2021   PLT 220 05/16/2021    Lab Results  Component Value Date   CREATININE 1.26 (H) 05/16/2021    No results found for: "PSA"  No results found for: "TESTOSTERONE"  No results found for: "HGBA1C"  Urinalysis    Component Value Date/Time   APPEARANCEUR Clear 04/21/2021 1545   GLUCOSEU Negative 04/21/2021 1545   BILIRUBINUR Negative 04/21/2021 1545   PROTEINUR Negative 04/21/2021 1545   NITRITE Negative 04/21/2021 1545   LEUKOCYTESUR Trace (A) 04/21/2021 1545    Lab Results  Component Value Date   LABMICR See below: 04/21/2021   WBCUA 0-5 04/21/2021   LABEPIT 0-10 04/21/2021   BACTERIA None seen 04/21/2021    Pertinent Imaging:  No results found for this or any previous visit.  No results found for this or any previous visit.  No results found for this or any previous visit.  No results found for this or any previous visit.  No results found for this or any previous visit.  No results found for this or any previous visit.  No results found for this or any previous visit.  No results found for this or any previous visit.   Assessment & Plan:    1. Malignant neoplasm of prostate ([place]) -PSa today. RTC 1 year with PSA - Urinalysis, Routine w reflex microscopic  2. Benign prostatic hyperplasia with lower urinary tract symptoms, symptom details unspecified -Patient doing well after simple prostatectomy - Urinalysis, Routine w reflex microscopic  3. Nocturia -patient to decrease fluid consumption prior to going to bed.    No follow-ups on file.  Nicolette Bang, MD  [facility] [place]

## 2022-04-26 LAB — PSA: Prostate Specific Ag, Serum: 0.7 ng/mL (ref 0.0–4.0)

## 2022-11-20 IMAGING — MR MR KNEE*L* W/O CM
4 of 6 series · 22 of 40 positions shown · non-contrast
Comparison: Radiograph 05/16/2021

CLINICAL DATA: Left knee pain and swelling and weakness. Fell and
injured knee 05/16/2021

EXAM:
MRI OF THE LEFT KNEE WITHOUT CONTRAST
TECHNIQUE: Multiplanar, multisequence MR imaging of the knee was performed. No
intravenous contrast was administered.

[Series 4: T2 fat-sat · coronal · 4.0mm · 0.66mm/px · 6 of 33 slices shown (1 of 2)]
[im 1/33]
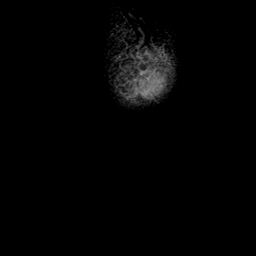
[im 7/33]
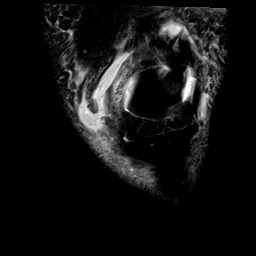
[im 13/33]
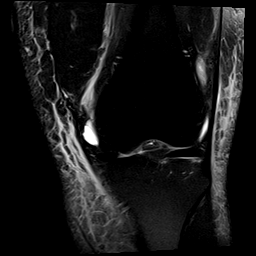
[im 20/33]
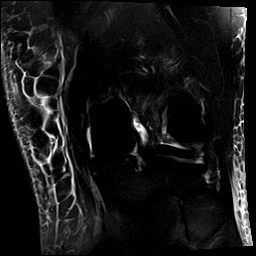
[im 26/33]
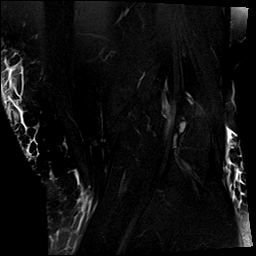
[im 33/33]
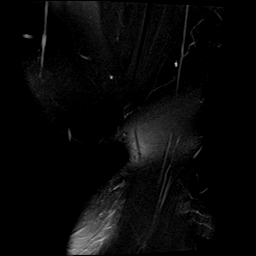

[Series 5: T1 · coronal · 4.0mm · 0.33mm/px · 3 of 33 slices shown]
[im 7/33]
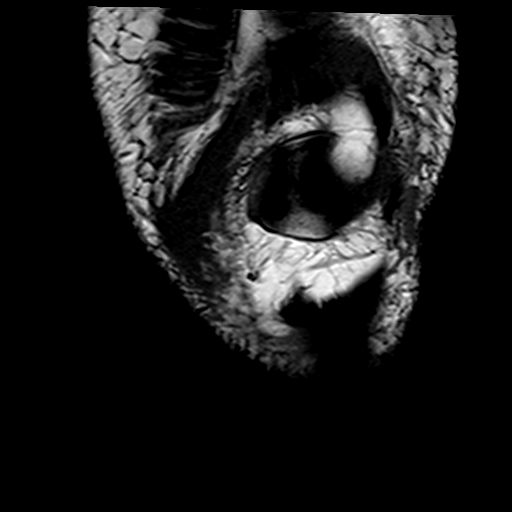
[im 20/33]
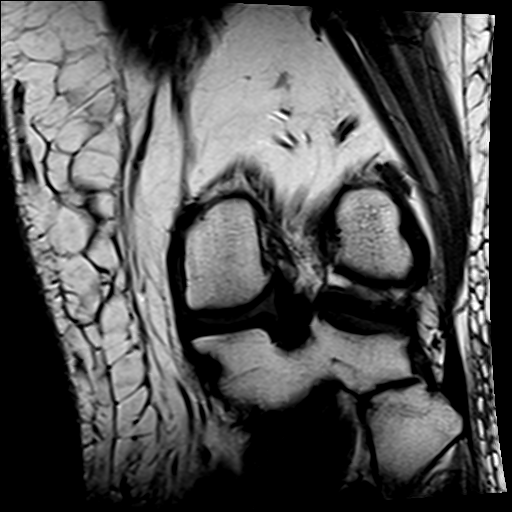
[im 33/33]
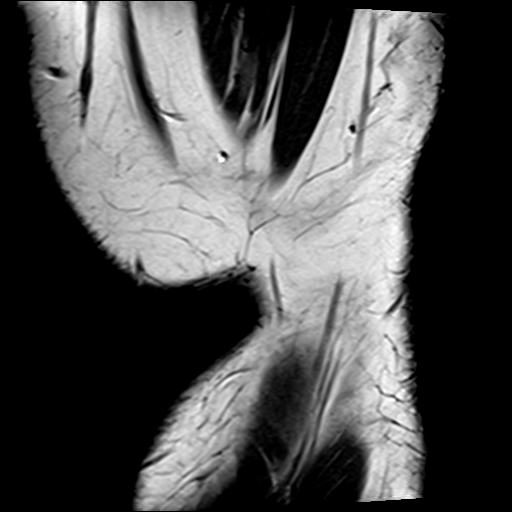

[Series 7: PD fat-sat · sagittal · 3.0mm · 0.37mm/px · 7 of 39 slices shown]
[im 1/39]
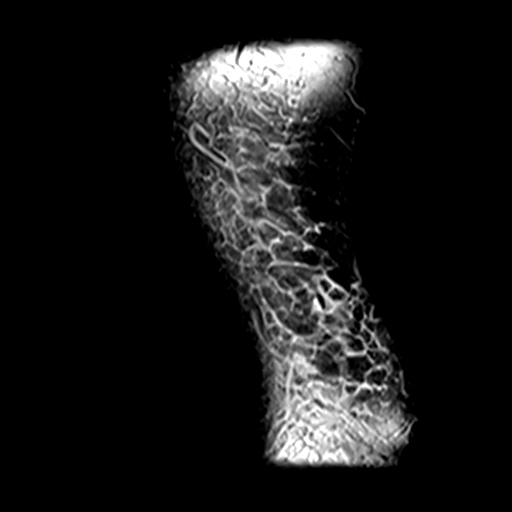
[im 7/39]
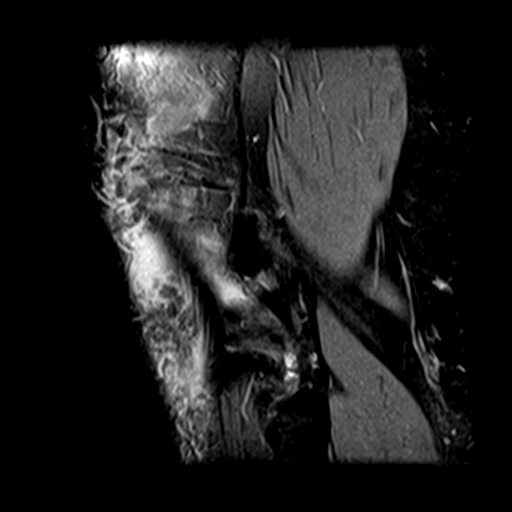
[im 13/39]
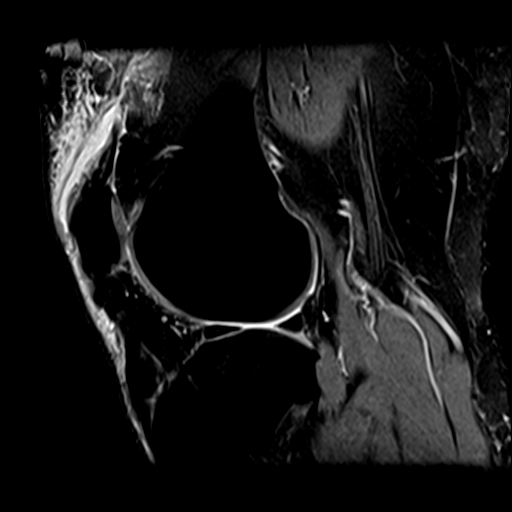
[im 20/39]
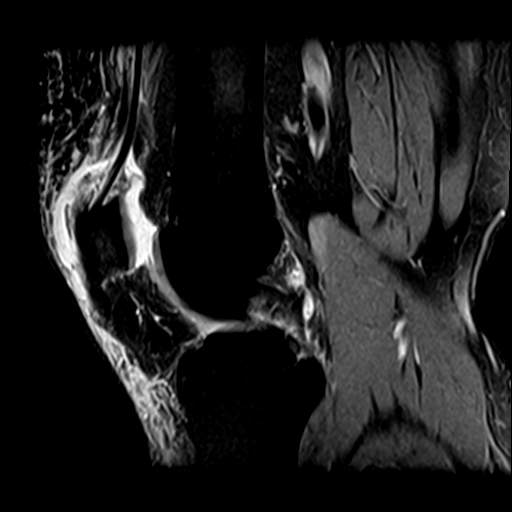
[im 26/39]
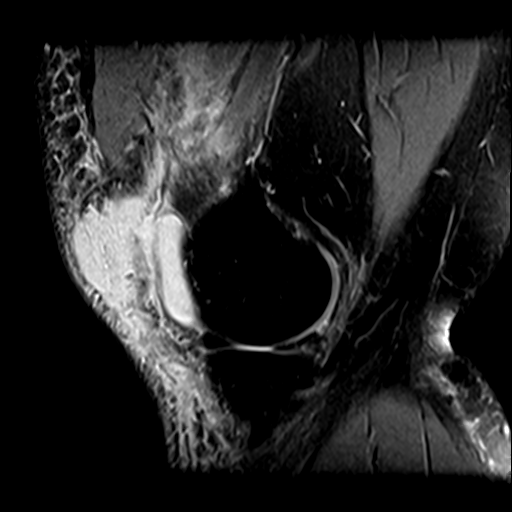
[im 32/39]
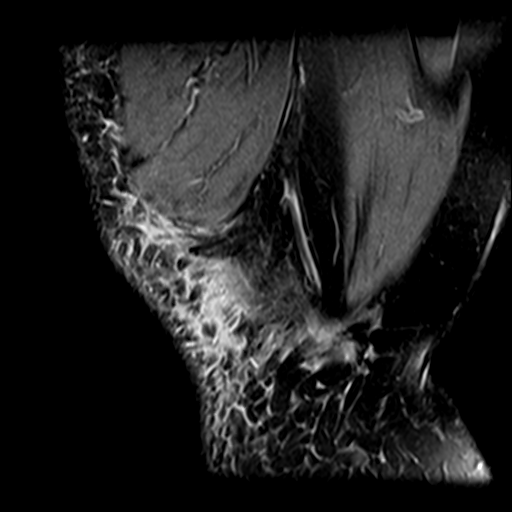
[im 39/39]
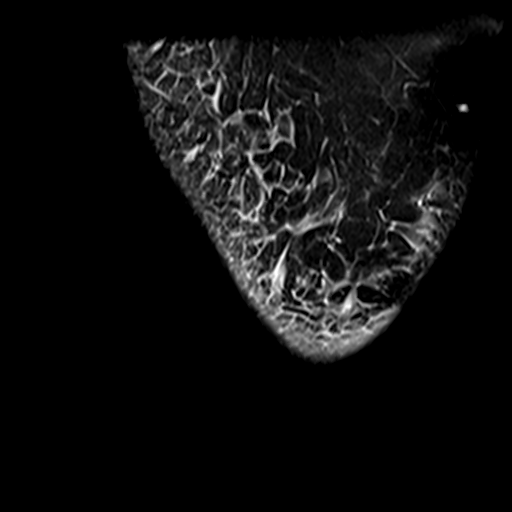

[Series 8: T2 fat-sat · sagittal · 3.0mm · 0.37mm/px · 6 of 39 slices shown (2 of 2)]
[im 1/39]
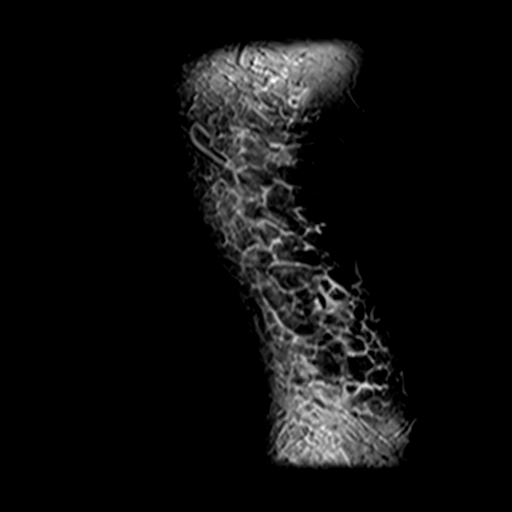
[im 7/39]
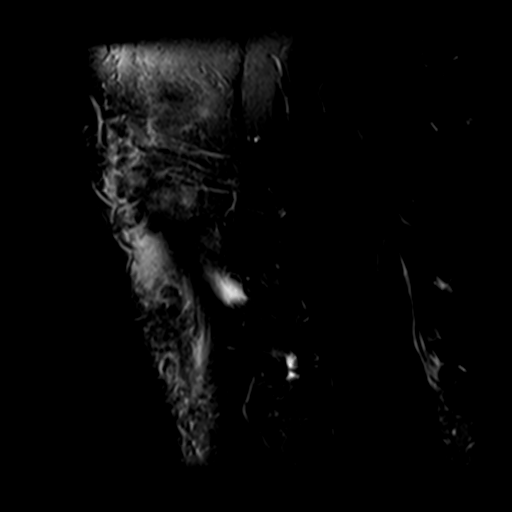
[im 13/39]
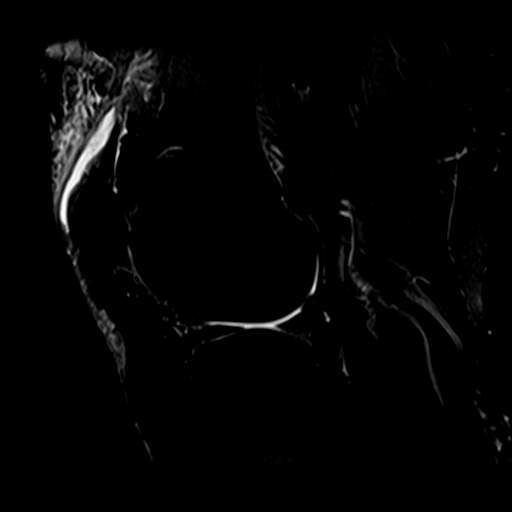
[im 20/39]
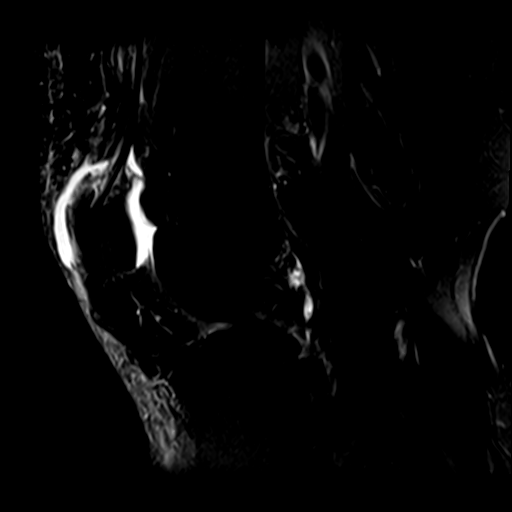
[im 26/39]
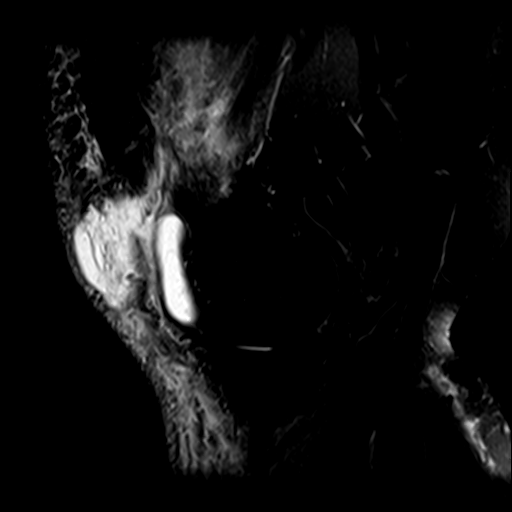
[im 32/39]
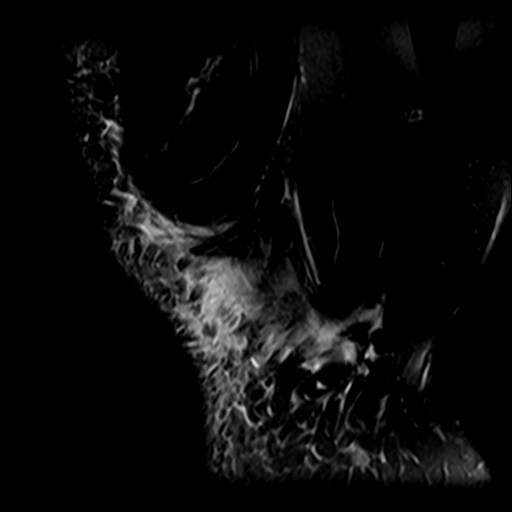

[22 of 40 positions shown; findings below may reference images not displayed]

FINDINGS: MENISCI

Medial meniscus: Partial-thickness radial tear involving the
posterior horn back near the meniscal root but no complete
tear/rupture or medial protrusion. There is also an oblique coursing
inferior articular surface tear involving the posterior horn and
midbody junction region.

Lateral meniscus:  Intact

LIGAMENTS

Cruciates:  Intact

Collaterals:  Intact

CARTILAGE

Patellofemoral:  Mild degenerative chondrosis

Medial:  Moderate degenerative chondrosis.

Lateral:  Mild degenerative chondrosis.

Joint:  Small to moderate-sized joint effusion.

Popliteal Fossa:  No popliteal mass or Baker's cyst.

Extensor Mechanism: The medial retinaculum and medial patellofemoral
ligament are intact. However, the quadriceps tendon is partially
torn. The VMO component is completely torn and there is at least
high-grade partial tearing of some of the middle fibers. The lateral
component is still intact. There is associated mild lateral tilt and
orientation of the patella. Associated significant
fluid/edema/hemorrhage around the medial and anterior aspect of the
knee.

The patellar tendon is intact but appears somewhat wavy and
thickened likely due to some loss of tension.

Bones:  No acute bony findings. No osteochondral lesion.

Other: The posterior musculature appears normal.
IMPRESSION: 1. Medial meniscus tears as detailed above.
2. Partial thickness tearing of the quadriceps tendon as detailed
above. The medial component is completely torn and the lateral
component is intact. Probable partial tearing of the central fibers.
It may be helpful to get some additional imaging on this patient
with quadriceps tendon rupture protocol (larger field-of-view and
more proximal coverage on the axial images), particularly if surgery
is a consideration.
3. Intact ligamentous structures and no acute bony findings.
4. Small to moderate-sized joint effusion.

## 2023-01-23 ENCOUNTER — Ambulatory Visit (INDEPENDENT_AMBULATORY_CARE_PROVIDER_SITE_OTHER): Payer: 59

## 2023-01-23 ENCOUNTER — Encounter (HOSPITAL_COMMUNITY): Payer: Self-pay | Admitting: Emergency Medicine

## 2023-01-23 ENCOUNTER — Ambulatory Visit (HOSPITAL_COMMUNITY)
Admission: EM | Admit: 2023-01-23 | Discharge: 2023-01-23 | Disposition: A | Discharge status: Home / Self Care | Payer: 59

## 2023-01-23 DIAGNOSIS — R051 Acute cough: Secondary | ICD-10-CM

## 2023-01-23 DIAGNOSIS — J189 Pneumonia, unspecified organism: Secondary | ICD-10-CM | POA: Diagnosis not present

## 2023-01-23 DIAGNOSIS — R059 Cough, unspecified: Secondary | ICD-10-CM | POA: Diagnosis not present

## 2023-01-23 MED ORDER — DOXYCYCLINE HYCLATE 100 MG PO CAPS: 100.0000 mg | ORAL_CAPSULE | Freq: Two times a day (BID) | ORAL | 0 refills | Status: AC

## 2023-01-23 MED ORDER — ALBUTEROL SULFATE HFA 108 (90 BASE) MCG/ACT IN AERS
2.0000 | INHALATION_SPRAY | Freq: Once | RESPIRATORY_TRACT | Status: AC
  Administered 2023-01-23: 2 via RESPIRATORY_TRACT

## 2023-01-23 MED ORDER — ALBUTEROL SULFATE HFA 108 (90 BASE) MCG/ACT IN AERS
INHALATION_SPRAY | RESPIRATORY_TRACT | Status: AC
  Filled 2023-01-23: qty 6.7

## 2023-01-23 MED ORDER — AMOXICILLIN-POT CLAVULANATE 875-125 MG PO TABS: 1.0000 | ORAL_TABLET | Freq: Two times a day (BID) | ORAL | 0 refills | Status: AC

## 2023-01-23 NOTE — ED Provider Notes (Addendum)
[place]    CSN: [id number] Arrival date & time: 01/23/23  0801      History   Chief Complaint Chief Complaint  Patient presents with   Cough   Nasal Congestion    HPI Juan [name] is a 62 y.o. male.   Patient presents today with a several week history of cough.  Reports recently symptoms have worsened prompting evaluation he is experiencing intermittent subjective fever, fatigue, congestion.  Denies any chest pain, shortness of breath, nausea, vomiting.  Denies any known sick contacts but has been around his grandchildren.  Denies any history of allergies, asthma, COPD.  Reports remote history of smoking but quit many years ago.  He does have a history of diabetes reports his blood sugar is adequately controlled.  He has tried over-the-counter medications including DayQuil, NyQuil, multisymptom medication without improvement of symptoms.  Denies any recent antibiotics or steroids.  He is having difficulty with daily activities as a result of symptoms.    Past Medical History:  Diagnosis Date   Anxiety    Depression    Diabetes mellitus without complication (HCC)    GERD (gastroesophageal reflux disease)    mild   Hypercholesterolemia    Hypertension    PONV (postoperative nausea and vomiting)    Sleep apnea     Patient Active Problem List   Diagnosis Date Noted   BPH with obstruction/lower urinary tract symptoms 06/13/2019    Past Surgical History:  Procedure Laterality Date   CHOLECYSTECTOMY     COLONOSCOPY  2017   KNEE ARTHROSCOPY WITH MENISCAL REPAIR Right    QUADRICEPS TENDON REPAIR Left 06/08/2021   Procedure: REPAIR QUADRICEP TENDON;  Surgeon: Tania Ade, MD;  Location: [facility];  Service: Orthopedics;  Laterality: Left;   VASECTOMY     WISDOM TOOTH EXTRACTION Bilateral    XI ROBOTIC ASSISTED SIMPLE PROSTATECTOMY N/A 06/13/2019   Procedure: XI ROBOTIC ASSISTED SIMPLE PROSTATECTOMY;  Surgeon: Cleon Gustin, MD;  Location: [facility];   Service: Urology;  Laterality: N/A;  3 HRS       Home Medications    Prior to Admission medications   Medication Sig Start Date End Date Taking? Authorizing Provider  amLODipine (NORVASC) 5 MG tablet Take 5 mg by mouth daily. 05/19/22  Yes [provider]  amoxicillin-clavulanate (AUGMENTIN) 875-125 MG tablet Take 1 tablet by mouth every 12 (twelve) hours. 01/23/23  Yes Korah Hufstedler K, PA-C  doxycycline (VIBRAMYCIN) 100 MG capsule Take 1 capsule (100 mg total) by mouth 2 (two) times daily. 01/23/23  Yes Adelfa Lozito, Juan Skill, PA-C  meloxicam (MOBIC) 15 MG tablet Take 1 tablet by mouth daily. 07/26/22  Yes [provider]  Semaglutide,0.25 or 0.5MG /DOS, 2 MG/3ML SOPN Inject 0.5 Inserts into the skin once a week. 12/19/22  Yes [provider]  clonazePAM (KLONOPIN) 1 MG tablet Take 1-2 mg by mouth See admin instructions. Take 1 mg in the morning and 2 mg at bedtime    [provider]  clonazePAM (KLONOPIN) 2 MG tablet Take by mouth. 01/18/22   [provider]  lisinopril (PRINIVIL,ZESTRIL) 20 MG tablet Take 20 mg by mouth daily.    [provider]  metFORMIN (GLUCOPHAGE) 500 MG tablet Take by mouth 2 (two) times daily with a meal.    [provider]  mirtazapine (REMERON) 30 MG tablet Take 30 mg by mouth at bedtime. 09/08/20   [provider]  omeprazole (PRILOSEC) 20 MG capsule Take 20 mg by mouth daily  as needed (acid reflux).    [provider]  oxymetazoline (AFRIN) 0.05 % nasal spray Place 1 spray into both nostrils 2 (two) times daily as needed for congestion.    [provider]  pravastatin (PRAVACHOL) 40 MG tablet Take 40 mg by mouth at bedtime.     [provider]  QUEtiapine (SEROQUEL) 200 MG tablet Take 200 mg by mouth daily.    [provider]  tiZANidine (ZANAFLEX) 2 MG tablet Take 1 tablet (2 mg total) by mouth every 8 (eight) hours as needed for muscle spasms. 06/08/21   Juan [name],  Amber, PA-C  ziprasidone (GEODON) 40 MG capsule Take 40 mg by mouth 2 (two) times daily. 01/18/22   [provider]    Family History Family History  Problem Relation Age of Onset   Hypertension Mother    Hypertension Father     Social History Social History   Tobacco Use   Smoking status: Former    Packs/day: 0.50    Years: 10.00    Additional pack years: 0.00    Total pack years: 5.00    Types: Cigarettes    Quit date: 1999    Years since quitting: 25.2   Smokeless tobacco: Never  Vaping Use   Vaping Use: Never used  Substance Use Topics   Alcohol use: Yes    Comment: occasionally   Drug use: No     Allergies   Codeine and Latex   Review of Systems Review of Systems  Constitutional:  Positive for activity change, fatigue and fever (intermittent subjective). Negative for appetite change.  HENT:  Positive for congestion, postnasal drip and sinus pressure. Negative for sneezing and sore throat.   Respiratory:  Positive for cough. Negative for shortness of breath.   Cardiovascular:  Negative for chest pain.  Gastrointestinal:  Negative for abdominal pain, diarrhea, nausea and vomiting.  Musculoskeletal:  Negative for arthralgias and myalgias.  Neurological:  Positive for headaches. Negative for dizziness and light-headedness.     Physical Exam Triage Vital Signs ED Triage Vitals  Enc Vitals Group     BP 01/23/23 0818 126/89     Pulse Rate 01/23/23 0818 97     Resp 01/23/23 0818 18     Temp 01/23/23 0818 99 F (37.2 C)     Temp Source 01/23/23 0818 Oral     SpO2 01/23/23 0818 96 %     Weight --      Height --      Head Circumference --      Peak Flow --      Pain Score 01/23/23 0817 0     Pain Loc --      Pain Edu? --      Excl. in [place]? --    No data found.  Updated Vital Signs BP 126/89 (BP Location: Left Arm)   Pulse 97   Temp 99 F (37.2 C) (Oral)   Resp 18   SpO2 96%   Visual Acuity Right Eye Distance:   Left Eye Distance:    Bilateral Distance:    Right Eye Near:   Left Eye Near:    Bilateral Near:     Physical Exam Vitals reviewed.  Constitutional:      General: He is awake.     Appearance: Normal appearance. He is well-developed. He is not ill-appearing.     Comments: Very pleasant male appears stated age in no acute distress sitting comfortably in exam room  HENT:  Head: Normocephalic and atraumatic.     Right Ear: Tympanic membrane, ear canal and external ear normal. Tympanic membrane is not erythematous or bulging.     Left Ear: Tympanic membrane, ear canal and external ear normal. Tympanic membrane is not erythematous or bulging.     Nose: Nose normal.     Mouth/Throat:     Pharynx: Uvula midline. No oropharyngeal exudate or posterior oropharyngeal erythema.  Cardiovascular:     Rate and Rhythm: Normal rate and regular rhythm.     Heart sounds: Normal heart sounds, S1 normal and S2 normal. No murmur heard. Pulmonary:     Effort: Pulmonary effort is normal. No accessory muscle usage or respiratory distress.     Breath sounds: No stridor. Examination of the right-lower field reveals decreased breath sounds. Examination of the left-lower field reveals decreased breath sounds. Decreased breath sounds present. No wheezing, rhonchi or rales.  Neurological:     Mental Status: He is alert.  Psychiatric:        Behavior: Behavior is cooperative.      UC Treatments / Results  Labs (all labs ordered are listed, but only abnormal results are displayed) Labs Reviewed - No data to display   EKG   Radiology DG Chest 2 View  Result Date: 01/23/2023 CLINICAL DATA:  Cough EXAM: CHEST - 2 VIEW COMPARISON:  10/27/2006 FINDINGS: The heart size and mediastinal contours are within normal limits. Both lungs are clear. Prominent interstitial markings within the medial aspect of the left lower lobe. Right lung is clear. No pleural effusion or pneumothorax. The visualized skeletal structures are  unremarkable. IMPRESSION: Prominent interstitial markings within the medial aspect of the left lower lobe, which may reflect atelectasis versus developing infiltrate. Electronically Signed   By: Juan [name] D.O.   On: 01/23/2023 08:45    Procedures Procedures (including critical care time)  Medications Ordered in UC Medications  albuterol (VENTOLIN HFA) 108 (90 Base) MCG/ACT inhaler 2 puff (2 puffs Inhalation Given 01/23/23 0834)    Initial Impression / Assessment and Plan / UC Course  I have reviewed the triage vital signs and the nursing notes.  Pertinent labs & imaging results that were available during my care of the patient were reviewed by me and considered in my medical decision making (see chart for details).     Patient is well-appearing, afebrile, nontoxic, nontachycardic.  No indication for viral testing as he has been symptomatic for several weeks and this would not change management.  Chest x-ray was obtained given decreased aeration of bilateral bases which showed abnormality in left lower lung consistent with atelectasis versus infiltrate.  Will treat for pneumonia given worsening cough and clinical presentation.  Patient was started on Augmentin twice daily for 7 days as well as doxycycline 100 mg twice daily for 7 days.  CBC and CMP were obtained and are pending.  If he has significant leukocytosis or abnormal kidney function would need to be seen emergently.  No indication for hospitalization at this time based on curb 65 score.  Recommend over-the-counter medications including Mucinex, Tylenol, Flonase for symptom relief.  He is to rest and drink plenty of fluid.  Discussed that he should have repeat chest x-ray in 2 to 4 weeks to ensure that this resolves with primary care.  He does not currently have a primary care so we will try to establish him with someone via PCP assistance but discussed that he can return here if necessary.  If he has any worsening  or changing symptoms  including chest pain, shortness of breath, high fever, weakness, nausea/vomiting interfering with oral intake he needs to be seen immediately.  Strict return precautions given.  Work excuse note provided.  Addendum: Rad tech and nursing staff were unable to obtain blood work.  Offered patient to push fluids and come back versus getting blood work.  Ultimately he preferred to cancel blood work for the time being but we again discussed alarm symptoms that warrant emergent evaluation and will seek immediate medical attention if anything changes.  Final Clinical Impressions(s) / UC Diagnoses   Final diagnoses:  Pneumonia of left lower lobe due to infectious organism  Acute cough     Discharge Instructions      Your x-ray showed that you might have a developing pneumonia in your left lower lung.  We are starting antibiotics to treat this.  Please start Augmentin twice daily for 7 days.  Take doxycycline 100 mg twice daily for 7 days.  Use over-the-counter medications including Robitussin for cough as well as Mucinex.  Make sure that you are resting and drinking plenty of fluid.  You can use the inhaler every 4-6 hours as needed with shortness of breath or coughing fits.  If your symptoms or not improving quickly please return for reevaluation.  You should follow-up with either [name] or your primary care within a few weeks to recheck your x-ray.  Someone should contact you to help schedule an appointment with primary care.  We will contact you if need to change your treatment plan based on your lab results.  If you have any worsening symptoms including fever, worsening cough, shortness of breath, nausea/vomiting interfere with oral intake, weakness you need to be seen immediately.     ED Prescriptions     Medication Sig Dispense Auth. Provider   doxycycline (VIBRAMYCIN) 100 MG capsule Take 1 capsule (100 mg total) by mouth 2 (two) times daily. 14 capsule Juan [name] K, PA-C   amoxicillin-clavulanate  (AUGMENTIN) 875-125 MG tablet Take 1 tablet by mouth every 12 (twelve) hours. 14 tablet Birtie Fellman, Juan Skill, PA-C      PDMP not reviewed this encounter.   Juan Croak, PA-C 01/23/23 [id number]    Juan Croak, PA-C 01/23/23 [phone]

## 2023-01-23 NOTE — Discharge Instructions (Signed)
Your x-ray showed that you might have a developing pneumonia in your left lower lung.  We are starting antibiotics to treat this.  Please start Augmentin twice daily for 7 days.  Take doxycycline 100 mg twice daily for 7 days.  Use over-the-counter medications including Robitussin for cough as well as Mucinex.  Make sure that you are resting and drinking plenty of fluid.  You can use the inhaler every 4-6 hours as needed with shortness of breath or coughing fits.  If your symptoms or not improving quickly please return for reevaluation.  You should follow-up with either [name] or your primary care within a few weeks to recheck your x-ray.  Someone should contact you to help schedule an appointment with primary care.  We will contact you if need to change your treatment plan based on your lab results.  If you have any worsening symptoms including fever, worsening cough, shortness of breath, nausea/vomiting interfere with oral intake, weakness you need to be seen immediately.

## 2023-01-23 NOTE — ED Triage Notes (Signed)
For two weeks having cough, congestion, fatigue, decreased appetite. Took cold and sinus medications.

## 2023-04-21 ENCOUNTER — Other Ambulatory Visit: Payer: 59

## 2023-04-24 ENCOUNTER — Other Ambulatory Visit: Payer: 59

## 2023-04-24 DIAGNOSIS — C61 Malignant neoplasm of prostate: Secondary | ICD-10-CM

## 2023-04-25 LAB — PSA: Prostate Specific Ag, Serum: 0.6 ng/mL (ref 0.0–4.0)

## 2023-04-28 ENCOUNTER — Encounter: Payer: Self-pay | Admitting: Urology

## 2023-04-28 ENCOUNTER — Ambulatory Visit (INDEPENDENT_AMBULATORY_CARE_PROVIDER_SITE_OTHER): Payer: 59 | Admitting: Urology

## 2023-04-28 VITALS — BP 121/79 | HR 71

## 2023-04-28 DIAGNOSIS — C61 Malignant neoplasm of prostate: Secondary | ICD-10-CM

## 2023-04-28 DIAGNOSIS — N401 Enlarged prostate with lower urinary tract symptoms: Secondary | ICD-10-CM

## 2023-04-28 DIAGNOSIS — N5201 Erectile dysfunction due to arterial insufficiency: Secondary | ICD-10-CM

## 2023-04-28 DIAGNOSIS — R351 Nocturia: Secondary | ICD-10-CM

## 2023-04-28 LAB — URINALYSIS, ROUTINE W REFLEX MICROSCOPIC
Bilirubin, UA: NEGATIVE
Glucose, UA: NEGATIVE
Ketones, UA: NEGATIVE
Nitrite, UA: NEGATIVE
Protein,UA: NEGATIVE
RBC, UA: NEGATIVE
Specific Gravity, UA: 1.025 (ref 1.005–1.030)
Urobilinogen, Ur: 1 mg/dL (ref 0.2–1.0)
pH, UA: 6.5 (ref 5.0–7.5)

## 2023-04-28 LAB — MICROSCOPIC EXAMINATION: Bacteria, UA: NONE SEEN

## 2023-04-28 MED ORDER — VARDENAFIL HCL 20 MG PO TABS: 20.0000 mg | ORAL_TABLET | Freq: Every day | ORAL | 0 refills | Status: AC | PRN

## 2023-04-28 NOTE — Progress Notes (Unsigned)
04/28/2023 10:21 AM   Juan [name] [birth year] [id number]  Referring provider: [facility], [facility] [address] [place],  [place] [address]  Followup prostate cancer   HPI: Juan [name] is a 61yo here for followup for prostate cancer, BPH and erectile dysfunction. PSA stable at 0.6. IPSS 2 QOL 0 after simple prostatectomy. He has issues getting an erection for the past several years. He has previously tried tadalafil and sildenafil.    PMH: Past Medical History:  Diagnosis Date   Anxiety    Depression    Diabetes mellitus without complication (HCC)    GERD (gastroesophageal reflux disease)    mild   Hypercholesterolemia    Hypertension    PONV (postoperative nausea and vomiting)    Sleep apnea     Surgical History: Past Surgical History:  Procedure Laterality Date   CHOLECYSTECTOMY     COLONOSCOPY  2017   KNEE ARTHROSCOPY WITH MENISCAL REPAIR Right    QUADRICEPS TENDON REPAIR Left 06/08/2021   Procedure: REPAIR QUADRICEP TENDON;  Surgeon: Jones Broom, MD;  Location: [facility];  Service: Orthopedics;  Laterality: Left;   VASECTOMY     WISDOM TOOTH EXTRACTION Bilateral    XI ROBOTIC ASSISTED SIMPLE PROSTATECTOMY N/A 06/13/2019   Procedure: XI ROBOTIC ASSISTED SIMPLE PROSTATECTOMY;  Surgeon: Malen Gauze, MD;  Location: [facility];  Service: Urology;  Laterality: N/A;  3 HRS    Home Medications:  Allergies as of 04/28/2023       Reactions   Codeine Swelling   Latex Rash        Medication List        Accurate as of April 28, 2023 10:21 AM. If you have any questions, ask your nurse or doctor.          amLODipine 5 MG tablet Commonly known as: NORVASC Take 5 mg by mouth daily.   amoxicillin-clavulanate 875-125 MG tablet Commonly known as: AUGMENTIN Take 1 tablet by mouth every 12 (twelve) hours.   clonazePAM 1 MG tablet Commonly known as: KLONOPIN Take 1-2 mg by mouth See admin instructions. Take 1 mg in the morning and 2  mg at bedtime   clonazePAM 2 MG tablet Commonly known as: KLONOPIN Take by mouth.   doxycycline 100 MG capsule Commonly known as: VIBRAMYCIN Take 1 capsule (100 mg total) by mouth 2 (two) times daily.   lisinopril 20 MG tablet Commonly known as: ZESTRIL Take 20 mg by mouth daily.   meloxicam 15 MG tablet Commonly known as: MOBIC Take 1 tablet by mouth daily.   metFORMIN 500 MG tablet Commonly known as: GLUCOPHAGE Take by mouth 2 (two) times daily with a meal.   mirtazapine 30 MG tablet Commonly known as: REMERON Take 30 mg by mouth at bedtime.   omeprazole 20 MG capsule Commonly known as: PRILOSEC Take 20 mg by mouth daily as needed (acid reflux).   oxymetazoline 0.05 % nasal spray Commonly known as: AFRIN Place 1 spray into both nostrils 2 (two) times daily as needed for congestion.   pravastatin 40 MG tablet Commonly known as: PRAVACHOL Take 40 mg by mouth at bedtime.   QUEtiapine 200 MG tablet Commonly known as: SEROQUEL Take 200 mg by mouth daily.   Semaglutide(0.25 or 0.5MG /DOS) 2 MG/3ML Sopn Inject 0.5 Inserts into the skin once a week.   tiZANidine 2 MG tablet Commonly known as: ZANAFLEX Take 1 tablet (2 mg total) by mouth every 8 (eight) hours as needed for muscle spasms.   ziprasidone  40 MG capsule Commonly known as: GEODON Take 40 mg by mouth 2 (two) times daily.        Allergies:  Allergies  Allergen Reactions   Codeine Swelling   Latex Rash    Family History: Family History  Problem Relation Age of Onset   Hypertension Mother    Hypertension Father     Social History:  reports that he quit smoking about 25 years ago. His smoking use included cigarettes. He has a 5.00 pack-year smoking history. He has never used smokeless tobacco. He reports current alcohol use. He reports that he does not use drugs.  ROS: All other review of systems were reviewed and are negative except what is noted above in HPI  Physical Exam: BP 121/79    Pulse 71   Constitutional:  Alert and oriented, No acute distress. HEENT: [place] AT, moist mucus membranes.  Trachea midline, no masses. Cardiovascular: No clubbing, cyanosis, or edema. Respiratory: Normal respiratory effort, no increased work of breathing. GI: Abdomen is soft, nontender, nondistended, no abdominal masses GU: No CVA tenderness.  Lymph: No cervical or inguinal lymphadenopathy. Skin: No rashes, bruises or suspicious lesions. Neurologic: Grossly intact, no focal deficits, moving all 4 extremities. Psychiatric: Normal mood and affect.  Laboratory Data: Lab Results  Component Value Date   WBC 8.8 05/16/2021   HGB 16.2 05/16/2021   HCT 51.4 05/16/2021   MCV 90.5 05/16/2021   PLT 220 05/16/2021    Lab Results  Component Value Date   CREATININE 1.26 (H) 05/16/2021    No results found for: "PSA"  No results found for: "TESTOSTERONE"  No results found for: "HGBA1C"  Urinalysis    Component Value Date/Time   APPEARANCEUR Clear 04/25/2022 1033   GLUCOSEU Negative 04/25/2022 1033   BILIRUBINUR Negative 04/25/2022 1033   PROTEINUR Negative 04/25/2022 1033   NITRITE Negative 04/25/2022 1033   LEUKOCYTESUR Negative 04/25/2022 1033    Lab Results  Component Value Date   LABMICR See below: 04/25/2022   WBCUA None seen 04/25/2022   LABEPIT 0-10 04/25/2022   BACTERIA None seen 04/25/2022    Pertinent Imaging:  No results found for this or any previous visit.  No results found for this or any previous visit.  No results found for this or any previous visit.  No results found for this or any previous visit.  No results found for this or any previous visit.  No valid procedures specified. No results found for this or any previous visit.  No results found for this or any previous visit.   Assessment & Plan:    1. Malignant neoplasm of prostate (HCC) -followup 1 year with PSA - Urinalysis, Routine w reflex microscopic  2. Benign prostatic hyperplasia  with lower urinary tract symptoms, symptom details unspecified -improved after simple prostatectomy  3. Nocturia resolved  4. Erectile dysfunction -levitra 20mg  prn   No follow-ups on file.  [name], MD  [facility] [place]

## 2023-05-04 ENCOUNTER — Encounter: Payer: Self-pay | Admitting: Urology

## 2023-05-04 NOTE — Patient Instructions (Signed)

## 2024-04-12 ENCOUNTER — Other Ambulatory Visit: Payer: 59

## 2024-04-19 ENCOUNTER — Ambulatory Visit: Payer: 59 | Admitting: Urology
